# Patient Record
Sex: Male | Born: 1940 | Race: White | Hispanic: No | Marital: Married | Smoking: Former smoker
Health system: Southern US, Community
[De-identification: ages and names within clinical notes are randomized; demographics above are authoritative.]

## PROBLEM LIST (undated history)

## (undated) DIAGNOSIS — I255 Ischemic cardiomyopathy: Secondary | ICD-10-CM

## (undated) DIAGNOSIS — N400 Enlarged prostate without lower urinary tract symptoms: Secondary | ICD-10-CM

## (undated) DIAGNOSIS — G4730 Sleep apnea, unspecified: Secondary | ICD-10-CM

## (undated) DIAGNOSIS — I2510 Atherosclerotic heart disease of native coronary artery without angina pectoris: Secondary | ICD-10-CM

## (undated) DIAGNOSIS — R011 Cardiac murmur, unspecified: Secondary | ICD-10-CM

## (undated) DIAGNOSIS — I509 Heart failure, unspecified: Secondary | ICD-10-CM

## (undated) DIAGNOSIS — I6529 Occlusion and stenosis of unspecified carotid artery: Secondary | ICD-10-CM

## (undated) DIAGNOSIS — N35914 Unspecified anterior urethral stricture, male: Secondary | ICD-10-CM

## (undated) DIAGNOSIS — N189 Chronic kidney disease, unspecified: Secondary | ICD-10-CM

## (undated) DIAGNOSIS — I495 Sick sinus syndrome: Secondary | ICD-10-CM

## (undated) DIAGNOSIS — I4891 Unspecified atrial fibrillation: Secondary | ICD-10-CM

## (undated) DIAGNOSIS — IMO0001 Reserved for inherently not codable concepts without codable children: Secondary | ICD-10-CM

## (undated) DIAGNOSIS — E785 Hyperlipidemia, unspecified: Secondary | ICD-10-CM

## (undated) DIAGNOSIS — N19 Unspecified kidney failure: Secondary | ICD-10-CM

## (undated) DIAGNOSIS — I77819 Aortic ectasia, unspecified site: Secondary | ICD-10-CM

## (undated) DIAGNOSIS — I493 Ventricular premature depolarization: Secondary | ICD-10-CM

## (undated) DIAGNOSIS — R3129 Other microscopic hematuria: Secondary | ICD-10-CM

## (undated) DIAGNOSIS — I701 Atherosclerosis of renal artery: Secondary | ICD-10-CM

## (undated) DIAGNOSIS — N390 Urinary tract infection, site not specified: Secondary | ICD-10-CM

## (undated) DIAGNOSIS — I10 Essential (primary) hypertension: Secondary | ICD-10-CM

## (undated) HISTORY — DX: Benign prostatic hyperplasia without lower urinary tract symptoms: N40.0

## (undated) HISTORY — DX: Occlusion and stenosis of unspecified carotid artery: I65.29

## (undated) HISTORY — DX: Atherosclerotic heart disease of native coronary artery without angina pectoris: I25.10

## (undated) HISTORY — DX: Aortic ectasia, unspecified site: I77.819

## (undated) HISTORY — DX: Unspecified atrial fibrillation: I48.91

## (undated) HISTORY — DX: Urinary tract infection, site not specified: N39.0

## (undated) HISTORY — DX: Heart failure, unspecified: I50.9

## (undated) HISTORY — DX: Essential (primary) hypertension: I10

## (undated) HISTORY — DX: Reserved for inherently not codable concepts without codable children: IMO0001

## (undated) HISTORY — DX: Unspecified kidney failure: N19

## (undated) HISTORY — DX: Chronic kidney disease, unspecified: N18.9

## (undated) HISTORY — DX: Unspecified anterior urethral stricture, male: N35.914

## (undated) HISTORY — DX: Atherosclerosis of renal artery: I70.1

## (undated) HISTORY — DX: Cardiac murmur, unspecified: R01.1

## (undated) HISTORY — DX: Other microscopic hematuria: R31.29

## (undated) HISTORY — DX: Hyperlipidemia, unspecified: E78.5

---

## 2003-06-19 ENCOUNTER — Other Ambulatory Visit: Payer: Self-pay

## 2006-02-09 ENCOUNTER — Ambulatory Visit: Payer: Self-pay | Admitting: Gastroenterology

## 2006-02-22 HISTORY — PX: CORONARY ARTERY BYPASS GRAFT: SHX141

## 2006-03-25 ENCOUNTER — Ambulatory Visit: Payer: Self-pay | Admitting: *Deleted

## 2006-04-14 ENCOUNTER — Ambulatory Visit: Payer: Self-pay | Admitting: *Deleted

## 2006-04-27 ENCOUNTER — Ambulatory Visit: Payer: Self-pay | Admitting: *Deleted

## 2006-04-29 ENCOUNTER — Ambulatory Visit: Payer: Self-pay | Admitting: *Deleted

## 2006-04-29 ENCOUNTER — Inpatient Hospital Stay (HOSPITAL_COMMUNITY): Admission: RE | Admit: 2006-04-29 | Discharge: 2006-04-30 | Payer: Self-pay | Admitting: *Deleted

## 2006-04-29 ENCOUNTER — Encounter (INDEPENDENT_AMBULATORY_CARE_PROVIDER_SITE_OTHER): Payer: Self-pay | Admitting: Specialist

## 2006-04-29 HISTORY — PX: CAROTID ENDARTERECTOMY: SUR193

## 2006-05-19 ENCOUNTER — Ambulatory Visit: Payer: Self-pay | Admitting: *Deleted

## 2006-09-23 ENCOUNTER — Ambulatory Visit: Payer: Self-pay | Admitting: Cardiovascular Disease

## 2006-10-11 ENCOUNTER — Inpatient Hospital Stay: Payer: Self-pay | Admitting: Internal Medicine

## 2006-10-11 ENCOUNTER — Other Ambulatory Visit: Payer: Self-pay

## 2006-11-16 ENCOUNTER — Ambulatory Visit: Payer: Self-pay | Admitting: Cardiovascular Disease

## 2006-11-22 ENCOUNTER — Ambulatory Visit: Payer: Self-pay | Admitting: Surgery

## 2006-11-24 ENCOUNTER — Ambulatory Visit: Admission: RE | Admit: 2006-11-24 | Discharge: 2006-11-24 | Payer: Self-pay | Admitting: Surgery

## 2006-11-25 ENCOUNTER — Encounter: Payer: Self-pay | Admitting: Thoracic Surgery (Cardiothoracic Vascular Surgery)

## 2006-11-25 ENCOUNTER — Ambulatory Visit: Payer: Self-pay | Admitting: Vascular Surgery

## 2006-11-25 ENCOUNTER — Ambulatory Visit (HOSPITAL_COMMUNITY)
Admission: RE | Admit: 2006-11-25 | Discharge: 2006-11-25 | Payer: Self-pay | Admitting: Thoracic Surgery (Cardiothoracic Vascular Surgery)

## 2006-12-01 ENCOUNTER — Ambulatory Visit: Payer: Self-pay | Admitting: *Deleted

## 2006-12-02 ENCOUNTER — Ambulatory Visit: Payer: Self-pay | Admitting: Cardiology

## 2006-12-09 ENCOUNTER — Ambulatory Visit: Payer: Self-pay | Admitting: Internal Medicine

## 2006-12-09 ENCOUNTER — Ambulatory Visit: Payer: Self-pay | Admitting: Surgery

## 2006-12-09 ENCOUNTER — Inpatient Hospital Stay (HOSPITAL_COMMUNITY): Admission: RE | Admit: 2006-12-09 | Discharge: 2006-12-16 | Payer: Self-pay | Admitting: Surgery

## 2006-12-09 ENCOUNTER — Encounter (INDEPENDENT_AMBULATORY_CARE_PROVIDER_SITE_OTHER): Payer: Self-pay | Admitting: *Deleted

## 2006-12-09 HISTORY — PX: CAROTID ENDARTERECTOMY: SUR193

## 2006-12-16 ENCOUNTER — Encounter: Payer: Self-pay | Admitting: Internal Medicine

## 2007-01-03 ENCOUNTER — Ambulatory Visit: Payer: Self-pay | Admitting: Surgery

## 2007-01-03 ENCOUNTER — Encounter: Admission: RE | Admit: 2007-01-03 | Discharge: 2007-01-03 | Payer: Self-pay | Admitting: Surgery

## 2007-01-05 ENCOUNTER — Ambulatory Visit: Payer: Self-pay | Admitting: *Deleted

## 2007-01-24 ENCOUNTER — Ambulatory Visit: Payer: Self-pay | Admitting: Internal Medicine

## 2007-01-25 ENCOUNTER — Encounter: Payer: Self-pay | Admitting: Cardiovascular Disease

## 2007-02-23 ENCOUNTER — Encounter: Payer: Self-pay | Admitting: Cardiovascular Disease

## 2007-03-09 ENCOUNTER — Ambulatory Visit: Payer: Self-pay | Admitting: *Deleted

## 2007-03-26 ENCOUNTER — Encounter: Payer: Self-pay | Admitting: Cardiovascular Disease

## 2007-03-30 ENCOUNTER — Ambulatory Visit: Payer: Self-pay | Admitting: *Deleted

## 2007-04-12 ENCOUNTER — Ambulatory Visit (HOSPITAL_COMMUNITY): Admission: RE | Admit: 2007-04-12 | Discharge: 2007-04-12 | Payer: Self-pay | Admitting: *Deleted

## 2007-04-12 ENCOUNTER — Ambulatory Visit: Payer: Self-pay | Admitting: *Deleted

## 2007-04-20 ENCOUNTER — Ambulatory Visit: Payer: Self-pay | Admitting: *Deleted

## 2007-04-23 ENCOUNTER — Encounter: Payer: Self-pay | Admitting: Cardiovascular Disease

## 2007-05-08 ENCOUNTER — Ambulatory Visit: Payer: Self-pay | Admitting: Internal Medicine

## 2008-03-28 ENCOUNTER — Ambulatory Visit: Payer: Self-pay | Admitting: *Deleted

## 2008-08-01 IMAGING — CR DG CHEST 1V PORT
1 series · 1 of 1 positions shown · non-contrast
Comparison: Portable chest x-ray yesterday and two-view chest x-ray 11/24/2006.

CLINICAL DATA: Postop CABG. Extubation.

PORTABLE CHEST - 1 VIEW  [DATE]/3111 1511 hours:

[view not recorded]
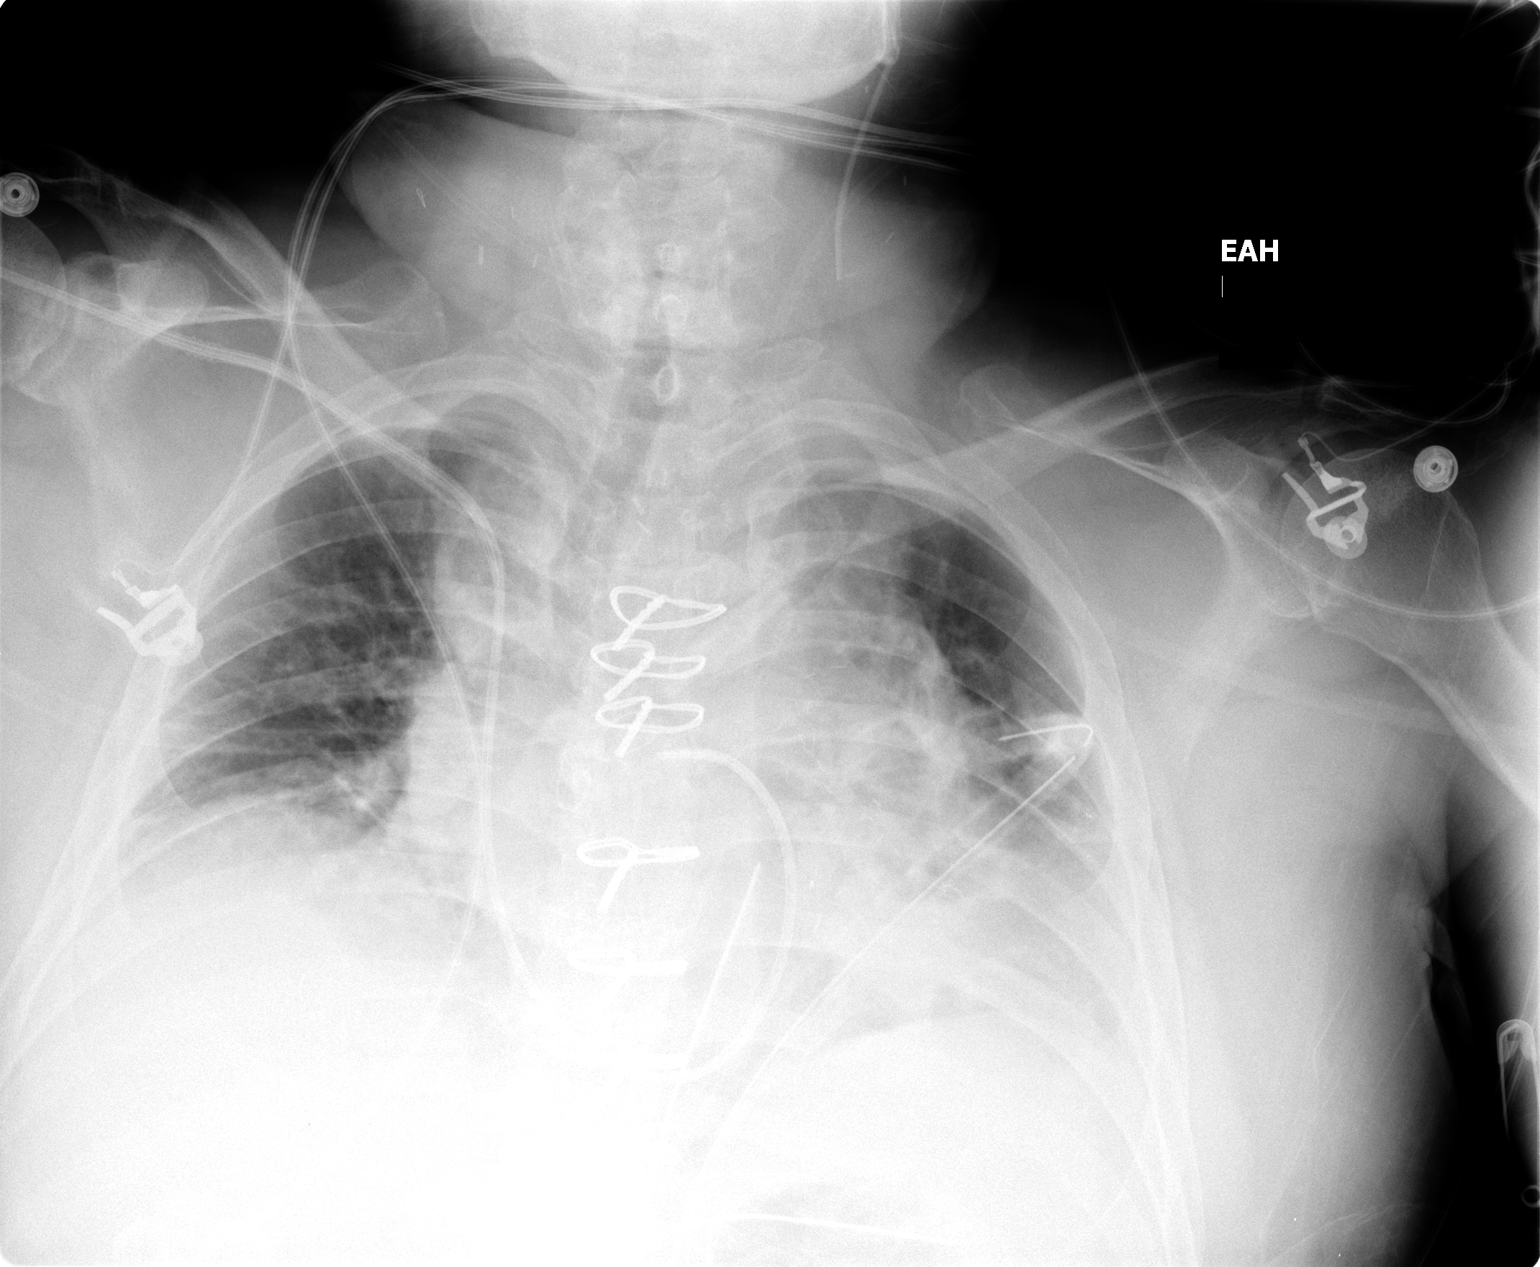

[1 of 1 positions shown; findings below may reference images not displayed]

FINDINGS: Interval extubation. Markedly suboptimal inspiration with atelectasis
in the lower lobes. Left chest tube in place with no pneumothorax. Swan-Ganz
catheter tip in the proximal right main pulmonary artery. Mediastinal drainage
tube. Stable cardiomegaly. Pulmonary venous hypertension without overt edema.
IMPRESSION: Remaining support apparatus satisfactory. No pneumothorax. Markedly suboptimal
inspiration post-extubation accounting for atelectasis in the lower lobes.

## 2009-03-26 ENCOUNTER — Ambulatory Visit: Payer: Self-pay | Admitting: Urology

## 2009-03-28 ENCOUNTER — Ambulatory Visit: Payer: Self-pay | Admitting: Vascular Surgery

## 2009-04-11 ENCOUNTER — Ambulatory Visit: Payer: Self-pay | Admitting: Family Medicine

## 2009-04-22 ENCOUNTER — Ambulatory Visit (HOSPITAL_COMMUNITY): Admission: RE | Admit: 2009-04-22 | Discharge: 2009-04-23 | Payer: Self-pay | Admitting: General Surgery

## 2009-04-22 ENCOUNTER — Encounter (INDEPENDENT_AMBULATORY_CARE_PROVIDER_SITE_OTHER): Payer: Self-pay | Admitting: General Surgery

## 2009-07-14 ENCOUNTER — Ambulatory Visit: Payer: Self-pay

## 2010-02-22 HISTORY — PX: CHOLECYSTECTOMY: SHX55

## 2010-04-06 ENCOUNTER — Ambulatory Visit (INDEPENDENT_AMBULATORY_CARE_PROVIDER_SITE_OTHER): Payer: Medicare Other | Admitting: Surgery

## 2010-04-06 ENCOUNTER — Other Ambulatory Visit (INDEPENDENT_AMBULATORY_CARE_PROVIDER_SITE_OTHER): Payer: Medicare Other

## 2010-04-06 DIAGNOSIS — Z48816 Encounter for surgical aftercare following surgery on the genitourinary system: Secondary | ICD-10-CM

## 2010-04-06 DIAGNOSIS — I6529 Occlusion and stenosis of unspecified carotid artery: Secondary | ICD-10-CM

## 2010-04-07 NOTE — Assessment & Plan Note (Signed)
OFFICE VISIT  Cody, Joseph DOB:  [birth year]                                       04/06/2010 ZOXWR#:[id number]  The patient comes in today for followup.  He is a former patient of Dr. Madilyn Fireman.  He has undergone bilateral carotid endarterectomy in 2008.  He has also been followed by Dr. Madilyn Fireman for renal artery stenosis which does not require intervention.  The patient is doing well at this time.  He has no vascular needs or issues.  He denies symptoms of TIA or stroke. Specifically no numbness, weakness in either extremity, no slurred speech, no amaurosis fugax.  PHYSICAL EXAM:  Vital signs:  Heart rate 63, blood pressure 138/80, respiratory rate 14.  General:  He is well-appearing, in no distress. HEENT:  Within normal limits.  Carotid incisions are all nicely healed. Lungs:  Clear bilaterally.  No wheezes.  Cardiovascular:  Regular rate and rhythm.  No murmur.  No carotid bruits.  Abdomen:  Obese. Neurological:  He is intact.  DIAGNOSTIC STUDIES:  Vascular lab studies were performed today and showed patent bilateral carotid endarterectomies without evidence of stenosis.  ASSESSMENT AND PLAN:  Status post bilateral carotid endarterectomy by Dr. Madilyn Fireman in 2008.  The patient will continue with ultrasound surveillance on a yearly basis.    Jorge Ny, MD Electronically Signed  VWB/MEDQ  D:  04/06/2010  T:  04/07/2010  Job:  [id number]  cc:   Dr Gabriel Cirri

## 2010-04-14 NOTE — Procedures (Unsigned)
CAROTID DUPLEX EXAM  INDICATION:  Bilateral carotid endarterectomies.  HISTORY: Diabetes:  No. Cardiac:  No. Hypertension:  Yes. Smoking:  Previous. Previous Surgery:  Right carotid endarterectomy on 04/29/2006, left carotid endarterectomy on 12/09/2006. CV History:  Currently asymptomatic. Amaurosis Fugax No, Paresthesias No, Hemiparesis No.                                      RIGHT             LEFT Brachial systolic pressure:         112               128 Brachial Doppler waveforms:         Normal            Normal Vertebral direction of flow:        Antegrade         Antegrade DUPLEX VELOCITIES (cm/sec) CCA peak systolic                   125               109 ECA peak systolic                   181               75 ICA peak systolic                   59                32 ICA end diastolic                   13                13 PLAQUE MORPHOLOGY:                  Heterogenous      Heterogenous PLAQUE AMOUNT:                      Mild              Mild PLAQUE LOCATION:                    CCA               CCA  IMPRESSION: 1. Patent bilateral carotid endarterectomy sites with no bilateral     internal carotid artery stenoses. 2. There is a velocity of 236 cm/s noted in the right proximal     subclavian artery which demonstrates turbulent flow. 3. No significant change in bilateral internal carotid arteries when     compared to the previous examination on 03/28/2009.  ___________________________________________ V. Charlena Cross, MD  CH/MEDQ  D:  04/07/2010  T:  04/07/2010  Job:  [id number]

## 2010-04-22 ENCOUNTER — Ambulatory Visit: Payer: Self-pay | Admitting: Gastroenterology

## 2010-05-05 HISTORY — PX: COLONOSCOPY: SHX174

## 2010-05-13 LAB — DIFFERENTIAL
Basophils Relative: 0 % (ref 0–1)
Lymphs Abs: 1.3 10*3/uL (ref 0.7–4.0)
Monocytes Relative: 11 % (ref 3–12)

## 2010-05-13 LAB — CBC
Platelets: 169 10*3/uL (ref 150–400)
RDW: 13.6 % (ref 11.5–15.5)

## 2010-05-13 LAB — COMPREHENSIVE METABOLIC PANEL
ALT: 20 U/L (ref 0–53)
Albumin: 3.8 g/dL (ref 3.5–5.2)
Alkaline Phosphatase: 66 U/L (ref 39–117)
CO2: 27 mEq/L (ref 19–32)
Chloride: 106 mEq/L (ref 96–112)
GFR calc non Af Amer: 55 mL/min — ABNORMAL LOW (ref >60)
Potassium: 4.2 mEq/L (ref 3.5–5.1)
Sodium: 139 mEq/L (ref 135–145)
Total Bilirubin: 0.6 mg/dL (ref 0.3–1.2)

## 2010-05-14 LAB — HM COLONOSCOPY

## 2010-07-07 NOTE — Discharge Summary (Signed)
NAMEJERICO, GRISSO              ACCOUNT NO.:  [id number]   MEDICAL RECORD NO.:  [id number]          PATIENT TYPE:  INP   LOCATION:  2033                         FACILITY:  [facility]   PHYSICIAN:  Evelene Croon, M.D.     DATE OF BIRTH:  1940/11/05   DATE OF ADMISSION:  12/09/2006  DATE OF DISCHARGE:  12/16/2006                               DISCHARGE SUMMARY   ADDENDUM:  Mr. Stan was tentatively felt to be stable for discharge on or about  December 15, 2006.  On that date, he had an episode of an atrial  tachycardia which appeared to be wide in complex.  This prompted a  cardiology consultation with Dr. Sherryl Manges, [facility].  Dr. Graciela Husbands has evaluated the patient and feels as  though this is an ectopic atrial dysrhythmia.  He should be continued on  his amiodarone as well as Coreg.  He was started on Cardizem earlier in  the hospitalization but this will be discontinued.  Dr. Graciela Husbands will  follow up with the patient in the future for further evaluation in  approximately 3 to 4 weeks and he has discussed this with the patient.   FINAL DIAGNOSIS:  Supraventricular tachycardia with aberration does not  appear to be an atrial flutter.   The patient's other diagnoses and hospital course is as previously  dictated.  For further details, please see that dictation.   Rowe Clack, P.A.-C. dictating for Evelene Croon, M.D.      Rowe Clack, P.A.-C.      Evelene Croon, M.D.  Electronically Signed    WEG/MEDQ  D:  12/16/2006  T:  12/17/2006  Job:  [id number]   cc:   Evelene Croon, M.D.  Duke Salvia, MD, [facility]  [name], MD  Lanna Poche, MD  P. Liliane Bade, M.D.

## 2010-07-07 NOTE — Op Note (Signed)
NAMEAVIS, MCMAHILL              ACCOUNT NO.:  [id number]   MEDICAL RECORD NO.:  [id number]          PATIENT TYPE:  INP   LOCATION:  2017                         FACILITY:  [facility]   PHYSICIAN:  Balinda Quails, M.D.    DATE OF BIRTH:  09/16/1940   DATE OF PROCEDURE:  12/09/2006  DATE OF DISCHARGE:                               OPERATIVE REPORT   SURGEON:  P Bud Face, MD   ASSISTANT:  Frutoso Schatz, PA.   ANESTHETIC:  General endotracheal.   PREOPERATIVE DIAGNOSIS:  Severe progressive left internal carotid artery  stenosis.   POSTOPERATIVE DIAGNOSIS:  Severe progressive left internal carotid  artery stenosis.   PROCEDURE:  Left carotid endarterectomy with saphenous vein patch  angioplasty.   CLINICAL NOTE:  Mr. [name] is a 70 year old male with known  atherosclerotic vascular disease.  He has previously undergone a right  carotid endarterectomy in March of this year.  He was seen by Dr. 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  He is scheduled to undergo coronary  bypass.  Preoperative Doppler revealed progression of left internal  carotid artery stenosis to greater than 80%.  He was seen in  consultation preoperatively and it was recommended he undergo common,  left carotid endarterectomy with coronary bypass for reduction stroke  risk.  He consented for surgery.  He is brought to the operating at this  time for combined left carotid endarterectomy and coronary artery  bypass.   OPERATIVE PROCEDURE:  The patient brought to the operating room stable  hemodynamic condition.  Placed under general endotracheal anesthesia.  Swan-Ganz catheter.  Foley line, arterial line in place.  The left neck,  chest and both legs were prepped and draped in sterile fashion.   Initial left carotid endarterectomy was carried out.  Curvilinear skin  incision made along the anterior border of left sternomastoid  muscle.  Dissection carried down through subcutaneous tissue with electrocautery.  Deep dissection carried through the platysma.  The facial vein ligated  with 2-0 silk and divided.  The left carotid bifurcation exposed.  The  common carotid artery mobilized down to the omohyoid muscle and  encircled with vessel loop.  Vagus nerve reflected posteriorly and  preserved.  At the bifurcation, the left superior thyroid external  carotid were freed and encircled with vessel loops.  The hypoglossal  nerve was then mobilized and retracted superiorly.  The left internal  carotid artery exposed up to the posterior belly of the digastric muscle  and encircled with vessel loop.   Evaluation left carotid bifurcation revealed extensive plaque disease  extending into the origin of left internal carotid artery.  The distal  left internal carotid artery was a soft vessel free of plaque.   The patient administered 7000 units heparin intravenously.  The left  carotid vessels controlled clamps.  Longitudinal arteriotomy made in the  distal left common carotid artery.  The arteriotomy extended across  carotid bulb and up into the internal carotid artery.  A shunt was  inserted.   There was  a large amount of plaque in the bulb and extending into the  left internal carotid artery.  Ulceration was present.  A high-grade  stenosis evident.   Plaque was removed with an endarterectomy elevator.  The plaque raised  down into the bulb where the superior thyroid and external carotid were  endarterectomized using an eversion technique.  The distal internal  carotid artery plaque feathered out well.  There was no significant  shelf distally.  Proximally the plaque was raised down into the common  carotid artery where it was divided transversely Potts scissors.  Site  irrigated with heparin saline solution.  Fragments plaque removed with  fine forceps.  Dextran irrigation also carried out.   A patch angioplasty  endarterectomy site was carried out with reverse  saphenous vein patch using running 6-0 Prolene suture.  The shunt was  then removed, all vessels well flushed.  Clamps removed directing  initial antegrade flow up the external carotid artery.  Following this,  the internal carotid was released.   There is an excellent pulse and Doppler signal in the distal internal  carotid artery.  Adequate hemostasis obtained.  At termination of the  coronary bypass procedure, the left neck was drained with a 15 round  Blake drain, exited inferior to the skin, affixed to skin with 2-0 silk  suture.  The sternomastoid fascia then closed running 2-0 Vicryl suture.  Platysma closed running 3-0 Vicryl suture.  Skin closed with 4-0  Monocryl.  Steri-Strips applied.  The patient tolerated procedure well.  Transferred directly to surgical intensive care unit for continued  monitoring.      Balinda Quails, M.D.  Electronically Signed     PGH/MEDQ  D:  12/09/2006  T:  12/12/2006  Job:  [id number]   cc:   Dr. Lajean Silvius, M.D.  Tama Gander, MD

## 2010-07-07 NOTE — Assessment & Plan Note (Signed)
OFFICE VISIT   Cody, Joseph  DOB:  1941-02-04                                       04/20/2007  ZOXWR#:[id number]   The patient returns today after undergoing an arteriogram for workup of  renal artery stenosis.  He had undergone a duplex scan, which revealed  potential bilateral renal artery stenosis.  He has had poorly controlled  hypertension.   His arteriogram revealed 2 left renal arteries and early bifurcation of  the main left renal artery.  There was mild to moderate stenosis present  in the inferior-most left renal artery.  The right renal system revealed  3 vessels.  The inferior-most revealed approximately 60% stenosis.  The  middle renal artery was without significant stenosis and the superior  pole revealed approximately 40% stenosis.  These were small vessels.   Due to the small size of these vessels and increased potential  complications associated with that, no intervention was undertaken.   The patient presents today for post-intervention visit.  His right groin  appears fine.  No evidence of pseudo-aneurysm or AV fistula.  Strong  right femoral pulse.  Mild ecchymosis.  No significant tenderness.   BP 138/74, pulse 46 per minute, respirations 16 per minute, O2  saturation 99%.   Will plan followup with him per parotid protocol in 6 months and a  repeat duplex evaluation in 1 year.   Balinda Quails, M.D.  Electronically Signed   PGH/MEDQ  D:  04/20/2007  T:  04/21/2007  Job:  [id number]   cc:   Dr. Gabriel Cirri

## 2010-07-07 NOTE — Procedures (Signed)
RENAL ARTERY DUPLEX EVALUATION   INDICATION:  Hypertension, renal artery stenosis.   HISTORY:  Diabetes:  No.  Cardiac:  No.  Hypertension:  Yes.  Smoking:  Quit in 1994.   RENAL ARTERY DUPLEX FINDINGS:  Aorta-Proximal:  53 cm/s  Aorta-Mid:  79 cm/s  Aorta-Distal:  92 cm/s  Celiac Artery Origin:  234 cm/s  SMA Origin:  187 cm/s                                    RIGHT               LEFT  Renal Artery Origin:             272/43 cm/s         151/39 cm/s  Renal Artery Proximal:           38/8 cm/s           116/27 cm/s  Renal Artery Mid:                36/11 cm/s          145/29 cm/s  Renal Artery Distal:             32/10 cm/s          36/12 cm/s  Hilar Acceleration Time (AT):    0.05 sec            0.02 sec  Renal-Aortic Ratio (RAR):        5.13                2.8  Kidney Size:                     9.5 cm X 5.7 cm     9.5 cm X 4.8 cm  End Diastolic Ratio (EDR):       0.36 to 0.29        0.33 to 0.37  Resistive Index (RI):            0.79                0.77   IMPRESSION:  1. Greater than 60% right renal artery stenosis.  2. Less than 60% left renal artery stenosis.  3. Kidneys are normal with respect to size and shape bilaterally.  4. No evidence of parenchymal disease bilaterally.   ___________________________________________  P. Liliane Bade, M.D.   MC/MEDQ  D:  03/09/2007  T:  03/09/2007  Job:  [id number]

## 2010-07-07 NOTE — Assessment & Plan Note (Signed)
OFFICE VISIT   [name], [name]  DOB:  1941-01-23                                        January 03, 2007  CHART #:  [id number]   The patient returns today for followup, status post coronary artery  bypass graft surgery x4 on 12/09/2006.  He also had a left carotid  endarterectomy performed by Dr. Liliane Bade at the same time.  He was  sent home on amiodarone for postoperative atrial fibrillation.  He was  seen by Dr. Sherryl Manges for evaluation of wide complex tachycardia  which was felt to be an atrial tachycardia.  He said that he saw Dr.  Graciela Husbands recently and his amiodarone was discontinued.  He has been walking  daily without chest pain or shortness of breath.   PHYSICAL EXAMINATION:  His blood pressure is 150/80, his pulse is 59 and  regular, respiratory rate is 18 and unlabored and oxygen saturation on  room air is 96%.  He looks well.  Cardiac exam shows a regular rate and  rhythm with normal heart sounds.  His lung exam is clear.  His chest  incision is healing well and the sternum is stable.  His leg incision is  healing well and there is no lower extremity edema.  His left neck  incision is healing well.  He appears neurologically intact.   Followup chest x-ray today shows a small left pleural effusion with left  basilar atelectasis.   MEDICATIONS:  His medications are Coreg 3.125 mg b.i.d., aspirin 81 mg  daily, Lipitor 80 mg nightly, Cardizem LA 120 mg daily, Plavix 75 mg  daily, and Xanax p.r.n.   IMPRESSION AND PLAN:  Overall, the patient appears to be making good  recovery following his surgery.  He appears to be maintaining a sinus  rhythm.  His amiodarone has been discontinued.  He is hypertensive today  and he may need further treatment for that.  His heart rate is  relatively slow, so I do not think he would tolerate an increase in his  Coreg.  He may benefit from an ACE inhibitor, although I do not know  what his creatinine is at  this time.  I will leave that decision up to  his primary physician and cardiologist.  I did encourage him to continue  walking as much as possible.  I told him he could return to driving a  car, but should refrain from lifting anything heavier than 10 pounds for  a total of 3 months from the date of surgery.  He will continue to  follow up with his primary physician and cardiologist and will contact  me if he develops any problems with his incisions.   Evelene Croon, M.D.  Electronically Signed   BB/MEDQ  D:  01/03/2007  T:  01/04/2007  Job:  [id number]   cc:   DR. Gabriel Cirri IN [place], [place]  DR. ARIDA IN Marcelline Mates, MD, FACC  P. Liliane Bade, M.D.

## 2010-07-07 NOTE — Procedures (Signed)
CAROTID DUPLEX EXAM   INDICATION:  Followup of carotid disease.   HISTORY:  Diabetes:  No.  Cardiac:  No.  Hypertension:  Yes.  Smoking:  Previous.  Previous Surgery:  Bilateral CEA.  CV History:  Asymptomatic.  Amaurosis Fugax Yes No, Paresthesias Yes No, Hemiparesis Yes No                                       RIGHT             LEFT  Brachial systolic pressure:         115               125  Brachial Doppler waveforms:         Triphasic         Triphasic  Vertebral direction of flow:        Antegrade         Antegrade  DUPLEX VELOCITIES (cm/sec)  CCA peak systolic                   120               147  ECA peak systolic                   213               191  ICA peak systolic                   89                54  ICA end diastolic                   39                20  PLAQUE MORPHOLOGY:                  Mixed             Mixed  PLAQUE AMOUNT:                      Mild              Mild  PLAQUE LOCATION:                    CCA, ICA          CCA, bifurcation   IMPRESSION:  1. Mild plaque noted bilaterally.  2. Bilateral internal carotid artery and left common carotid artery      are tortuous.  3. Bilateral external carotid artery stenosis.   ___________________________________________  Larina Earthly, M.D.   CJ/MEDQ  D:  03/28/2009  T:  03/28/2009  Job:  [id number]

## 2010-07-07 NOTE — Letter (Signed)
January 24, 2007    [name], MD  [address]  [place], [place]  [address]   RE:  [name], [name]  MRN:  [id number]  /  DOB:  [birth year]   Dear Dr. Kirke Corin:   Mr. Odonohue is seen today following his hospitalization at [facility] for  bypass surgery that was notable for wide complex tachycardia. The  mechanism which appeared to be supraventricular tachycardia with  aberration, but not atrial fibrillation. He was treated with amiodarone  for the last five weeks and he has had no recurrent palpitations of  which he is aware.   Apparently, he has also had an intercurrent echo which he recalls you  saying looked fine.   His current medications include amiodarone, Plavix, aspirin, carvedilol  and Micardis and Lipitor.   On examination today, his blood pressure is borderline elevated at  138/80, his pulse was 49.  LUNGS:  Clear.  HEART: Sounds were regular.  EXTREMITIES: Were warm without edema.   Electrocardiogram dated today demonstrated sinus rhythm at 49 with  interval at 0.19/0.12/0.50. The axis was mildly leftward at -24.   IMPRESSION:  1. Wide complex tachycardia thought to be CT with aberration.  2. Ischemic heart disease with:      a.     Prior bypass surgery.      b.     Previously depressed left ventricular function at estimated       30-40%, but apparently now near normal.  3. Peripheral vascular disease with impending surgery.  4. History of renal failure on ACE inhibitors.   Mr. Maready is doing terrific. I am very glad to hear that his echo was  normal.   We will stop his amiodarone today and I will plan to see him in three  months time just for one more time just to make sure there are no  further problems with palpitations. If there is anything that I can do  further in the interim with him or with any of your other patients,  please do not hesitate to contact me.    Sincerely,      Duke Salvia, MD, [facility]  Electronically Signed    SCK/MedQ   DD: 01/24/2007  DT: 01/24/2007  Job #: [id number]   CC:    Balinda Quails, M.D.

## 2010-07-07 NOTE — Op Note (Signed)
[name], [name]              ACCOUNT NO.:  [id number]   MEDICAL RECORD NO.:  [id number]          PATIENT TYPE:  INP   LOCATION:  2017                         FACILITY:  [facility]   PHYSICIAN:  Evelene Croon, M.D.     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   OPERATIVE PROCEDURE:  Median sternotomy, extracorporeal circulation,  coronary artery bypass graft surgery x4 using a left internal mammary  artery graft to the left anterior descending coronary artery, with a  sequential saphenous vein graft to the first and second obtuse marginal  branches of the left circumflex coronary artery, and a saphenous vein  graft to the posterior descending branch of the left circumflex coronary  artery.  Endoscopic vein harvesting from the right leg.   ATTENDING SURGEON:  Evelene Croon, M.D.   ASSISTANT:  Sheliah Plane, MD.   SECOND ASSISTANT:  Rowe Clack, P.A.-C.   ANESTHESIA:  General endotracheal.   CLINICAL HISTORY:  This patient is a 70 year old gentleman with multiple  cardiac risk factors who underwent right carotid endarterectomy in March  of 2008.  He apparently had an echocardiogram and a stress test at that  time that were abnormal.  He underwent cardiac catheterization on September 23, 2006 which showed left main and severe two-vessel coronary artery  disease.  He was then scheduled for percutaneous intervention on  November 16, 2006, but catheterization at that time showed 60% distal  left main stenosis.  There was 80% ostial LAD stenosis and a 70% mid-LAD  stenosis.  There was 70% ostial left circumflex stenosis.  The second  marginal had an ostial stenosis.  The right coronary artery was a small  nondominant vessel  without significant disease.  After review of the  angiogram and examination of the patient, it was felt that coronary  artery bypass graft surgery was the best treatment.  Preoperative  carotid Dopplers showed a high grade left internal carotid artery  stenosis.  He was seen in consultation by Dr. Bud Face who felt  that concomitant left carotid endarterectomy should be performed to  decrease the stroke risk.  I discussed the operative procedure of  coronary artery bypass surgery with him and his wife, including  alternatives, benefits and risks, including bleeding, blood transfusion,  infection, stroke, myocardial infarction, graft failure and death.  He  understood this and agreed to proceed.   OPERATIVE PROCEDURE:  The patient was taken to the operating room and  placed on the table in the supine position.  After induction of general  endotracheal anesthesia, a Foley catheter was placed in the bladder  using sterile technique.  Then the chest, abdomen and both lower  extremities were prepped and draped in the usual sterile manner.  The  left neck was prepped into the operative field.  Then a left carotid  endarterectomy was performed by  Dr. Bud Face and will be dictated  separately.  At the conclusion of that procedure, the left neck was  packed with a gauze pad and closed loosely with sutures for temporary  hemostasis while the coronary artery bypass surgery was being performed.   Then the chest was opened through a median sternotomy incision.  The  pericardium was opened in the midline.  Examination of the heart showed  good ventricular contractility.  The ascending aorta had no palpable  plaques in it.  There was a large amount of epicardial fat.   Then the left internal mammary artery was harvested from the chest wall  as a pedicle graft.  This was a medium caliber vessel with ________.  At  the same time a segment of the greater saphenous vein was harvested from  the  right leg using endoscopic vein harvest technique.  This vein was of  medium size and good quality.   Then the patient was heparinized and, when an adequate activated partial  thromboplastin time was achieved, the distal ascending aorta was  cannulated using a 20-French aortic cannula for arterial inflow.  Venous  outflow was achieved using a two-stage venous cannula for the right  atrial appendage.  An antegrade cardioplegia and bent cannula was  inserted in the aortic root.  Then the patient was placed in  cardiopulmonary bypass and the distal coronary was identified.  The LAD  was a large graftable vessel.  The diagonal branches were tiny vessels.  The left circumflex gave off 2 large marginal vessels, both of which  were graftable.  It terminated in a posterior descending branch which  was a moderate sized vessel that was graftable.  The right coronary  artery was a small nondominant vessel that was beneath the epicardial  fat and not visible.   Then the aorta was cross-clamped and 1000 mL of cold blood antegrade  cardioplegia was administered in the aortic root with quick arrest of  the heart.  Systemic hypothermia to 28 degrees centigrade and topical  hypothermia flush saline was used.  A temperature probe was placed in  the septum, insufflated and passed to the pericardium.   Then the first distal anastomosis was performed to the first marginal  branch.  The internal diameter of this vessel was about 2 mm.  The  conduit used was a segment of the greater saphenous vein and the  anastomosis performed in a sequential side-to-side manner using  continuous 7-0 Prolene suture.  Flow was measured to the graft and was  excellent.   The second distal anastomosis was performed to the second marginal  branch.  The internal diameter was also about 2 mm.  The conduit used  was the same segment of greater saphenous vein and the anastomosis  performed in a sequential end-to-side manner using  continuous 7-0  Prolene suture.  Flow was measured to the graft and was excellent.  Then  another dose of cardioplegia was given down to the main graft and in the  aortic root.   The third distal anastomosis was performed in the posterior descending  coronary artery.  The internal diameter of this vessel was about 1.6 mm.  The conduit used was a third segment of greater saphenous vein and the  anastomosis performed in an end-to-side manner using continuous 7-0  Prolene suture.  Flow was measured through the graft and was excellent.   The fourth distal anastomosis was performed in the midportion of the  left anterior descending  coronary artery.  The internal diameter of this  vessel was about 2.5 mm.  The conduit used was a left internal mammary  graft and this was brought through an opening in the left pericardium  anterior to the phrenic nerve.  This was anastomosed to the LAD in an  end-to-side manner using continuous 8-0 Prolene suture.  The pedicle was  sutured to the epicardium with 6-0 Prolene sutures.  The patient was  rewarmed to 37 degrees centigrade.  With a cross-clamp in place, the two  proximal vein graft anastomoses were performed to the aortic root in an  end-to-side manner using continuous 6-0 Prolene suture.  Then the clamp  was moved from the mammary pedicle.  There was rapid warming of the  ventricular septum and return was spontaneous ventricular fibrillation.  The cross-clamp was removed with a time of 72 minutes, and the patient  was defibrillated in a sinus rhythm.  The proximal and distal  anastomoses appeared hemostatic and LITA graft satisfactory.  Graft  markers were placed around the proximal anastomosis.  Temporary right  ventricular and right atrial pacing wires were placed and pulled through  the skin.   When the patient had rewarmed to 37 degrees centigrade, he was weaned  from cardiopulmonary bypass on no medications.  Total bypass time was 90   minutes.  Cardiac function appeared excellent with a cardiac output of 5  L/min.  Protamine was  given and venous and aortic cannula was removed  without difficulty.  Hemostasis was achieved.  Three chest tubes were  placed with 2 in the posterior pericardium, 1 in the left pleural space  and 1 in the anterior mediastinum.  The sternum was then closed with  double #6 stainless steel wires.  The fascia was closed with continuous  #1 Vicryl suture.  Subcutaneous tissue was closed with continuous 2-0  Vicryl and the skin with 3-0 Vicryl subcuticular closure.  The lower  extremity vein harvest site was closed in layers in a similar manner.  Sponge, needle and instrument counts were correct according to the scrub  nurse.  Dry sterile dressings were applied over the incisions, around  the chest tubes which were hooked to Pleur-evac suction.   Then the left neck was opened, removing the temporary sutures and the  gauze packing.  Hemostasis was achieved without difficulty..  There was  a good pulsation in the internal carotid artery beyond the patch.  A 10-  French flat Jackson-Pratt drain was pulled through a separate stab  incision and positioned in the depth of the wound.  The muscles were  then reapproximated with continuous 2-0 Vicryl suture.  The skin was  closed with a 3-0 Vicryl subcuticular closure.  The sponge, needle and  instrument counts were correct.  A dry sterile dressing was applied over  this wound.  The patient was then transported to the surgical intensive  care unit in guarded but stable condition.      Evelene Croon, M.D.  Electronically Signed     BB/MEDQ  D:  12/09/2006  T:  12/10/2006  Job:  [id number]   cc:   Dr. Rachel Moulds  Dr. Sharman Cheek

## 2010-07-07 NOTE — Procedures (Signed)
RENAL ARTERY DUPLEX EVALUATION   INDICATION:  Follow-up right renal artery stenosis.   HISTORY:  Diabetes:  No.  Cardiac:  No.  Hypertension:  Yes.  Smoking:  Quit.   RENAL ARTERY DUPLEX FINDINGS:  Aorta-Proximal:  101 cm/s  Aorta-Mid:  95 cm/s  Aorta-Distal:  90 cm/s  Celiac Artery Origin:  191 cm/s  SMA Origin:  216 cm/s                                    RIGHT               LEFT  Renal Artery Origin:             127 cm/s            182 cm/s  Renal Artery Proximal:                               182 cm/s  Renal Artery Mid:                61 cm/s             173 cm/s  Renal Artery Distal:             63 cm/s             129 cm/s  Hilar Acceleration Time (AT):    0.058 sec           0.048 sec  Renal-Aortic Ratio (RAR):        1.26                1.8  Kidney Size:                     10.5 cm X 5.14 cm   10.9 cm X 5.9 cm  End Diastolic Ratio (EDR):  Resistive Index (RI):            0.74                0.71   IMPRESSION:  1. Previously documented elevated velocities at right renal artery      origin suggestive of >60% stenosis were unable to be replicated.  2. Left renal artery shows no evidence of significant stenosis.  3. Bilateral kidneys appear within normal limits in regards to shape      and size.  4. Technically difficult study due to body habitus and bowel gas.  5. Dr. Madilyn Fireman was informed of results and stated to recheck in 1 year.   ___________________________________________  P. Liliane Bade, M.D.   AS/MEDQ  D:  03/28/2008  T:  03/28/2008  Job:  [id number]

## 2010-07-07 NOTE — Letter (Signed)
May 08, 2007    [name], MD  [address]  [place], [place] [address]   RE:  [name], [name]  MRN:  [id number]  /  DOB:  [birth year]   Dr. Kirke Corin,   [name] comes in.  His palpitations are now quiescent off the  amiodarone.   He has undergone an extensive intercurrent evaluation for hypertension  and potentially secondary to renal artery hypertension, which turned out  to be non-incriminating.   His blood pressure today was not too bad at 146/75.  His pulse was a low  at 45.  Heart sounds were regular.  The extremities were without edema,  and his lungs were clear.   IMPRESSION:  1. Palpitations, specifically atrial arrhythmias.  2. Modest bradycardia.  3. Coronary artery disease with prior bypass grafting (October, 2008).  4. Exercise-associated relative hypotension, as noted in cardiac      rehabilitation.   [name] is doing fine from an arrhythmia point of view.  I am  bothered a little bit by his exercise-associated low blood pressure,and  I wonder whether it may not be worth putting him on a treadmill to see  what is happening with that.  I do not understand what the mechanism  would be, from what he describes.   As it relates to his palpitations, he seemed to be quiescent, so I have  told him that we would be glad to see him again as needed or at your  request.  Thanks very much for allowing [place] to participate in his care.    Sincerely,      Duke Salvia, MD, [facility]  Electronically Signed    SCK/MedQ  DD: 05/08/2007  DT: 05/08/2007  Job #: [id number]

## 2010-07-07 NOTE — Op Note (Signed)
[name], BOSSMAN              ACCOUNT NO.:  [id number]   MEDICAL RECORD NO.:  [id number]          PATIENT TYPE:  AMB   LOCATION:  SDS                          FACILITY:  [facility]   PHYSICIAN:  Juleen China IV, MDDATE OF BIRTH:  06-Jul-1940   DATE OF PROCEDURE:  04/12/2007  DATE OF DISCHARGE:  04/12/2007                               OPERATIVE REPORT   PREOPERATIVE DIAGNOSIS:  Rule out right renal artery stenosis.   POSTOPERATIVE DIAGNOSIS:  Rule out right renal artery stenosis.   PROCEDURE PERFORMED:  1. Ultrasound-guided right common femoral artery access.  2. Abdominal aortogram.  3. Retrograde right femoral artery angiogram and closure device      (StarClose).   COMPLICATIONS:  None.   INDICATIONS:  This is a 70 year old gentleman with a history of vascular  disease who was found to have right renal artery stenosis greater than  60% on duplex ultrasound.  He has had increase in the number of blood  pressure medicines he requires.  He comes in for arteriogram  with  possibility of intervention.   DESCRIPTION OF PROCEDURE:  The patient was identified in the holding  area and taken to room 7.  He was placed supine on the table.  Bilateral  groins were prepped and draped in standard sterile fashion.  A  time-out  was called.  The right common femoral artery was evaluated with  ultrasound and found to be widely patent.  Lidocaine 1% was used for  local anesthesia.  Using ultrasound guidance, the right common femoral  artery was accessed with an 18-gauge needle.  An 0.035 Bentson wire was  then advanced in a retrograde fashion into the abdominal aorta under  fluoroscopic visualization.  A 6-French sheath was placed.  Over the  wire, Omniflush catheter was placed in the level of L1, and abdominal  aortogram was obtained.  Multiple obliquities were utilized to best  evaluate the renal arteries.   FINDINGS:  The visualized portions of the suprarenal abdominal aorta  showed  minimal disease.  There are 2 left renal arteries with an early  bifurcation of the main left renal artery.  There does not appear to be  any evidence of hemodynamically significant stenosis.  There are 3 right  renal arteries visualized.  The inferior renal artery shows  approximately 60% stenosis.  The middle renal artery is without  significant stenosis.  The superior, which is the main renal artery, has  luminal narrowing of approximately 40%.  Remaining portion of the  infrarenal abdominal aorta is widely patent.  It is heavily calcified.   After the above dimensions were obtained, a decision was made not to  intervene.  A retrograde right femoral artery angiogram was performed to  evaluate for closure device.  Access was found to be appropriate.  StarClose was successfully deployed.  The patient tolerated the  procedure well and was taken to the recovery room in stable condition.   IMPRESSION:  1. Three right renal arteries with mild-to-moderate stenosis within      the inferior branch.  The superior branch appears to be the main  artery and it is narrowing approximately 40%.  2. Two left renal arteries without significant stenosis.           ______________________________  V. Charlena Cross, MD  Electronically Signed     VWB/MEDQ  D:  04/12/2007  T:  04/12/2007  Job:  [id number]

## 2010-07-07 NOTE — Consult Note (Signed)
VASCULAR SURGERY CONSULTATION   AMIR, FICK R  DOB:  June 21, 1940                                       12/01/2006  BJYNW#:[id number]   PRIMARY CARE PHYSICIAN:  Steele Sizer.   PRIMARY CARDIOLOGIST:  Dr. Romilda Joy in [place].   REASON FOR CONSULTATION:  Progressive severe left internal carotid  artery stenosis.   HISTORY:  Patient is a 70 year old gentleman with known atherosclerotic  vascular disease.  He was seen in consultation by Dr. Laneta Simmers on  September 30th of this year with left main and severe two vessel  coronary artery disease.  Aftrer evaluation by Dr. Laneta Simmers, he was  scheduled to undergo coronary artery bypass.   Preoperative Doppler evaluation revealed a severe left internal carotid  artery stenosis greater than 80% by velocity measurement.  Moderate-to-  severe soft, noncalcific plaque was noted in the distal left common  carotid artery and Protonix to mid left internal carotid artery.   Patient is status post right carotid end arterectomy carried out on  April 29, 2006 at [facility].  Doppler evaluation reveals his  right carotid end arterectomy site to be widely patent.  Vertebral flow  is antegrade bilaterally.   Patient has no history of documented stroke.  He denies symptoms of  sensory, motor, or visual deficit.  No speech problems.  He is right-  handed.   PAST MEDICAL HISTORY:  1. Hyperlipidemia.  2. Hypertension.  3. Prostate nodule.  4. Cardiomyopathy with reduced ejection fraction.   MEDICATIONS:  1. Baby aspirin 81 mg daily.  2. Micardis 40 mg daily.  3. Coreg 40 mg daily.  4. Lipitor 80 mg daily.  5. Plavix 75 mg daily (discontinued September 30th).   ALLERGIES:  None known.   FAMILY HISTORY:  Mother died age 13 with complications of myocardial  infarction.  She also had a history of stroke.  Father died at age 15  with a history of myocardial infarction.   SOCIAL HISTORY:  Patient is semi-retired.   Works for the [employer]  system four hours daily.  He is married.  Does not use tobacco products.  Discontinued tobacco use 14 years ago.  Had previously smoked for  approximately 20 pack years.  Does not consume alcohol.  Has two grown  children.   REVIEW OF SYSTEMS:  No weight change or anorexia.  Denies fevers or  chills.  No chest pain, shortness of breath, or palpitations.  Denies  cough, sputum, or hemoptysis.  No abdominal pain, constipation, or  diarrhea.  Denies dysuria, frequency, or hematuria.  Musculoskeletal,  neurologic, hematologic, eyes, ears, nose, and throat negative.  He does  not some anxiety recently and insomnia awaiting his upcoming surgery.   PHYSICAL EXAMINATION:  General:  Patient is alert and oriented in no  acute distress.  BP is 180/83, pulse 45 per minute, respirations 18 per  minute.  HEENT:  Mouth and throat clear.  Extraocular movements are  intact.  Normocephalic.  Neck:  Supple.  No thyromegaly or adenopathy.  Cardiovascular:  Left carotid bruit.  Normal heart sounds.  No murmurs,  rubs or gallops.  Respiratory:  Air entry equal bilaterally.  No rales  or rhonchi.  Abdomen:  Soft, nontender.  No masses or organomegaly.  Bowel sounds active.  No bruits.  Musculoskeletal:  No bony deformities.  Full range of motion of upper and lower extremities.  Neurologic:  Patient is alert and oriented.  Moves extremities to command.  Sensation  intact.  Cranial nerves are intact.   IMPRESSION:  1. Severe progressive asymptomatic left internal carotid artery      stenosis.  2. Multivessel coronary artery disease.  3. Cardiomyopathy with reduced ejection fraction.  4. Hypertension.  5. Hyperlipidemia.  6. Obesity.  7. Situational anxiety.   MEDICAL DECISION MAKING:  Patient has critical coronary artery disease  which will require a coronary artery bypass.  Discovered to have  progressive, asymptomatic left internal carotid artery stenosis.  Will  plan  left carotid end arterectomy concomitant with coronary artery  bypass.  Potential risk of the operative procedure discussed with the  patient and his wife.  Major morbidity mortality directly related to his  carotid surgery 1-2%.   Procedure will be scheduled in consultation with Dr. Laneta Simmers.   Balinda Quails, M.D.  Electronically Signed  PGH/MEDQ  D:  12/01/2006  T:  12/02/2006  Job:  [id number]   cc:   Evelene Croon, M.D.  Dr. Vonita Moss  Dr. Romilda Joy in [place]

## 2010-07-07 NOTE — Consult Note (Signed)
[name], [name] NO.:  [id number]   MEDICAL RECORD NO.:  [id number]          PATIENT TYPE:  INP   LOCATION:  2033                         FACILITY:  [facility]   PHYSICIAN:  Duke Salvia, MD, FACCDATE OF BIRTH:  Jun 04, 1940   DATE OF CONSULTATION:  12/16/2006  DATE OF DISCHARGE:  12/16/2006                                 CONSULTATION   Thank you very much for asking [place] to see Mr. Shaydon Lease in  consultation for wide complex tachycardia.   Mr. Ostrovsky is a 70 year old gentleman who has known vascular disease  with prior CEA who presented with chest pain during the summer, had an  abnormal Myoview, underwent catheterization demonstrating significant  coronary disease. Anticipated PCI was ultimately deferred because of  progression of left main disease, and the patient was referred to Dr.  Laneta Simmers for revascularization which was accomplished about a week ago.   His ejection fraction has been measured over the summer at both 40% in  May and 30% in August. Apparently no left ventriculogram was undertaken  I think because of a history of acute renal failure in the context of  renal artery stenosis and ACE inhibition.  The peak creatinine was 3.7.   The postoperative course has been complicated by recurrent  tachyarrhythmia.  These are abrupt in onset and offset.  They are of  relatively short duration.  There is a wide complex version and a narrow  complex version with similar RR intervals.   There is also frequent atrial ectopy in a pattern of bigeminy and  trigeminy as well as frequent wide complex prematures which I suspect  are also, at least in large part, atrial in origin.   Thromboembolic risk factors are notable for hypertension and LV  dysfunction.   PAST MEDICAL HISTORY:  In addition to the above is notable for:  1. Dyslipidemia.  2. Prostatic hypertrophy.   REVIEW OF SYSTEMS:  Is otherwise broadly negative.   SOCIAL HISTORY:  He is married.   He has children.  He works  with the  [employer] and plays a lot of golf.   CURRENT MEDICATIONS:  1. Lipitor 80.  2. Protonix 40.  3. Lovenox 40.  4. Coreg 3.125 b.i.d.  5. Plavix.  6. Amiodarone 400 b.i.d.  7. Lasix 40.   KCl and Cardizem currently on hold.   DRUG INTOLERANCES:  Are related to his renal artery stenosis and include  ACE INHIBITORS.   PHYSICAL EXAMINATION:  GENERAL:  He is a middle-age, older Caucasian  male appearing his stated age of 70.  VITAL SIGNS:  His blood pressure is 122/74.  His pulse was 60.  Respirations were 18.  He is afebrile.  HEENT:  Exam demonstrated no icterus or xanthomata.  NECK:  No jugular venous distention was appreciated.  Carotids were not  examined.  BACK:  Without kyphosis or scoliosis.  LUNGS:  Clear.  HEART:  Sounds were regular without murmurs or gallops.  ABDOMEN:  Soft with active bowel sounds.  EXTREMITIES:  There is trace left edema, a little bit more edema on the  right.  SKIN:  Warm and dry.  NEUROLOGIC:  Exam was grossly normal.   Heart tracings are as noted previously.   IMPRESSION:  1. Recurrent supraventricular tachycardia with aberration and a cycle      length of about 400 milliseconds.  I do not think that this atrial      flutter given the brief runs and may well be atrial tachycardia or      a reentrant supraventricular tachycardia.  2. Ischemic cardiomyopathy.      a.     Status post coronary artery bypass grafting.      b.     Ejection fraction of 30-40%.      c.     Significant left atrial enlargement.  3. Peripheral vascular disease with prior bilateral carotid      endarterectomy.  4. History of acute renal failure on ACE inhibitor therapy.   Mr. Prien arrhythmia does not appear to be atrial fibrillation or  atrial flutter.  Therapeutic control for his symptoms has been initiated  with amiodarone and Coreg, and think that this is reasonable with the  idea that amiodarone should be  discontinued I think in the next 4 weeks  or so.   In the event that the patient has recurrent arrhythmia, ongoing use of  beta blockers and potentially calcium blockers, depending on his  ejection fraction, or catheter ablation could be considered.   Given the nature the arrhythmia, I do not think that Coumadin is  indicated.   Given his problems with ACE inhibitors, if his LV dysfunction persists,  I would consider the use of hydralazine-nitrate combinations for  reduction cardiovascular mortality and  morbidity risks.   Thank you very much for the consultation.      Duke Salvia, MD, [facility]  Electronically Signed     SCK/MEDQ  D:  12/16/2006  T:  12/17/2006  Job:  [id number]   cc:   Tama Gander, MD  Dr. Kary Kos, Alinda Dooms, M.D.

## 2010-07-07 NOTE — Consult Note (Signed)
NEW PATIENT CONSULTATION   KNOWLEDGE, ESCANDON R  DOB:  10/22/1940                                        November 22, 2006  CHART #:  [id number]   REFERRING PHYSICIAN:  Dr. [name].   REASON FOR CONSULTATION:  Left main and severe two vessel coronary  artery disease.   CLINICAL HISTORY:  I was asked by Dr. Kirke Corin to evaluate this gentleman  for consideration of coronary artery bypass graft surgery.  He is a 70-  year-old man with a strong family history of heart disease and a history  of previous smoking, who reports being asymptomatic, as far as his heart  disease goes.  He said that he was seen for a prostate infection at the  beginning of the year and subsequently underwent a physical examination  and was found to have a right cervical bruit.  Carotid Dopplers showed a  greater than 80% right internal carotid artery stenosis.  The patient  was seen by Dr. Park Liter in consultation and eventually by Dr.  Bud Face.  He underwent right carotid endarterectomy by Dr. Madilyn Fireman  on April 29, 2006.  The patient also subsequently underwent an  echocardiogram and a stress test.  I do not have the results of these  but according to the patient, it showed an enlarged heart with a  decreased ejection fraction as well as some valve leakage.  He then  underwent a cardiac catheterization on September 23, 2006, which showed left  main and severe two vessel coronary artery disease.  I do not have that  cath report or the films from that catheterization.  He then was  scheduled for a percutaneous intervention on November 16, 2006.  The  catheterization at that time showed a 60% distal left main stenosis.  There was 80% ostial LAD and 70% mid LAD stenosis.  There is 70% left  ostial left circumflex stenosis.  The right coronary artery was reported  to be a small nondominant vessel without significant disease.  It is  unclear whether a left ventriculogram was performed,  but I do not see  any result of that in the cath note.  Apparently, the decision was made  to not perform percutaneous intervention and to refer the patient for  surgical treatment.  The patient subsequently was admitted to the  hospital with heat exhaustion and dehydration with acute renal failure  on October 14, 2006.  He said that he played golf and then went into work  for about four hours.  Although he was drinking a lot of fluids the  whole time, he noted that he did not urinate at all and started feeling  very poorly.  He was admitted with a creatinine of around 3.7, which  improved with intravenous hydration to 1.2.  This episode of acute renal  failure was felt to be secondary to a combination of renal artery  stenosis, an ACE inhibitor, dehydration, and recent cardiac  catheterization.  Since then, the patient says that he has been feeling  fairly well.   REVIEW OF SYSTEMS:  GENERAL:  He denies any fever or chills.  He has had  no recent weight changes.  He denies fatigue but in talking to his wife,  it sounds like he has had some decreased exertional tolerance over the  past  year.  EYES:  Negative.  ENT:  Negative.  ENDOCRINE:  He denies diabetes and hypothyroidism.  CARDIOVASCULAR:  He denies any chest pain or pressure.  He denies  exertional shortness of breath.  He had no palpitations.  He denies PND  or orthopnea.  RESPIRATORY:  He has had no cough or sputum production.  He denies  hemoptysis.  There is no history of asthma.  GI:  He denies nausea or vomiting.  He denies melena or bright red blood  per rectum.  GU:  He has had no dysuria or hematuria.  He has had no problems since  his episode of acute renal failure.  VASCULAR:  He denies claudication and phlebitis.  He has never had a  vein stripping.  He denies any focal weakness or numbness.  He denies  dizziness or syncope.  He has never had a TIA or a stroke.  PSYCHIATRIC:  Negative .  HEMATOLOGICAL:  He denies  any history of bleeding disorders or easy  bleeding.  He has been on Plavix, and the last dose was taken yesterday.   ALLERGIES:  None.   FAMILY HISTORY:  Strongly positive for heart disease.  His mother died  at age 50 of myocardial infarction.  Father died at age 38 of myocardial  infarction.   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  He is a remote smoker and quit about 14  years ago.  He denies alcohol abuse.  He lives with his wife.   PAST MEDICAL HISTORY:  Significant for hypertension.  He has a history  of cardiomyopathy with an ejection fraction documented at about 31%.  He  has a history of peripheral vascular disease, status post right carotid  endarterectomy in March, 2008.  He has a known left internal carotid  artery stenosis that was about 50-79% on March 09, 2006.  This has  been followed by Dr. Bud Face.  He has a history of BPH and  prostatitis.  He has a history of hyperlipidemia.   PHYSICAL EXAMINATION:  Blood pressure is 148/77.  His pulse is 46 and  regular.  Respiratory rate is 18 and unlabored.  Oxygen saturation on  room air is 97%.  He is a well-developed, moderately obese white male in  no distress.  HEENT:  Normocephalic and atraumatic.  Pupils are equal,  round and reactive to light and accommodation.  Extraocular muscles are  intact.  Throat is clear.  Neck exam shows normal carotid pulses  bilaterally.  There is no bruit.  There is a right cervical scar from an  endarterectomy.  There is no adenopathy or thyromegaly.  Cardiac exam  shows a regular rate and rhythm with a normal S1 and S2.  There is no  murmur, rub or gallop.  His lungs are clear.  Abdomen exam shows active  bowel sounds.  His abdomen is soft, moderately obese and nontender.  There are no palpable masses or organomegaly.  Extremities:  No  peripheral edema.  Pedal pulses are palpable bilaterally.  His skin was  warm and  dry.  Neurologic:  Alert and oriented x3.  Motor and sensory  exams are grossly normal.   IMPRESSION:  Patient has left main and severe two vessel coronary artery  disease that is asymptomatic, according to him.  He has a history of  cardiomyopathy with an ejection fraction of about 31%.  I agree that  coronary artery bypass graft surgery is the best treatment to prevent  further ischemia and infarction as well as sudden death.  I discussed  the operative procedure with he and his wife, including alternatives,  risks and benefits, including but not limited to bleeding, blood  transfusion, infection, stroke, myocardial infarction, graft failure,  renal failure, and death.  They understand and would like to proceed  with surgery as quickly as possible.  We will plan to do this next week.   Evelene Croon, M.D.  Electronically Signed   BB/MEDQ  D:  11/22/2006  T:  11/22/2006  Job:  [id number]   cc:   [name], M.D.  Dr. Vonita Moss

## 2010-07-07 NOTE — Assessment & Plan Note (Signed)
OFFICE VISIT   JAKIN, PAVAO  DOB:  [birth year]                                       03/30/2007  ZOXWR#:[id number]   Patient is an established patient with a history of coronary artery  bypass and carotid endarterectomy carried out in October, 2008.  He had  a right carotid endarterectomy carried out in March, 2008.  Further  chronic medical conditions include hyperlipidemia, hypertension,  cardiomyopathy, and prostate nodule.   Following his most recent carotid endarterectomy and coronary artery  bypass procedure, he has had accelerated hypertension.  He is currently  on Micardis 40 mg daily, hydrochlorothiazide 12.5 mg daily, Coreg 6.25  mg b.i.d., Lipitor 10 mg daily, Plavix 75 mg daily, and aspirin 81 mg  daily.   Renal artery Doppler evaluation carried out on March 09, 2007 reveals  a >60% right renal artery stenosis, <60% left renal artery stenosis.  Kidneys are normal in size and shape bilaterally.   Patient has noted no recent chest pain or shortness of breath.   PHYSICAL EXAMINATION:  BP 139/81, pulse 59 per minute, respirations 18  per minute.  He is generally well.  No acute distress.  Alert and  oriented.  Abdomen is soft and nontender.  No abdominal bruits.  Femoral  pulses 2+ bilaterally.   I have reviewed these results with the patient and have recommended we  go ahead and do a diagnostic arteriogram with selective renal  arteriography and determine whether intervention would be appropriate to  improve blood pressure control and maintain renal function.  He is  agreeable to this.  I have discussed the details of this, including  potential risk of a major complication of 1%.  He has agreed to go ahead  with the procedure, which is scheduled for 04/12/07 at [facility].   Balinda Quails, M.D.  Electronically Signed   PGH/MEDQ  D:  03/30/2007  T:  04/03/2007  Job:  [id number]   cc:   Dr. Gabriel Cirri, [place], [place]

## 2010-07-07 NOTE — Procedures (Signed)
RENAL ARTERY DUPLEX EVALUATION   INDICATION:  Followup of a right renal artery stenosis.   HISTORY:  Diabetes:  No.  Cardiac:  No.  Hypertension:  Yes.  Smoking:  Previous.   RENAL ARTERY DUPLEX FINDINGS:  Aorta-Proximal:  139 cm/s  Aorta-Mid:  105 cm/s  Aorta-Distal:  109 cm/s  Celiac Artery Origin:  328 cm/s  SMA Origin:  391 cm/s                                    RIGHT               LEFT  Renal Artery Origin:             284 cm/s            163 cm/s  Renal Artery Proximal:           162, 139 cm/s       121 cm/s  Renal Artery Mid:                149, 113 cm/s       92 cm/s  Renal Artery Distal:             101, 97 cm/s        112 cm/s  Hilar Acceleration Time (AT):    m/s2                m/s2  Renal-Aortic Ratio (RAR):        2.04, 1             1.17  Kidney Size:                     7.4 x 4.25 cm       10 x 6.09 cm  End Diastolic Ratio (EDR):  Resistive Index (RI):            0.65, 0.56          0.69   IMPRESSION:  1. Right kidney is obstructed due to gallstone shadowing.  2. Three right renal arteries, one is greater than 60% stenosis, two      is free of stenosis , three is not visualized.  3. Superior mesenteric artery and celiac stenosis.  4. Left renal appears patent and free from stenosis.  5. Patient no longer needs to be seen for renal duplex per Dr. Arbie Cookey      unless symptoms occur.   ___________________________________________  Larina Earthly, M.D.   CJ/MEDQ  D:  03/28/2009  T:  03/28/2009  Job:  [id number]

## 2010-07-07 NOTE — Discharge Summary (Signed)
[name], [name]              ACCOUNT NO.:  [id number]   MEDICAL RECORD NO.:  [id number]          PATIENT TYPE:  INP   LOCATION:  2033                         FACILITY:  [facility]   PHYSICIAN:  Evelene Croon, M.D.     DATE OF BIRTH:  12-28-1940   DATE OF ADMISSION:  12/09/2006  DATE OF DISCHARGE:  12/13/2006                               DISCHARGE SUMMARY   FINAL DIAGNOSES:  1. Left main and severe three-vessel coronary artery disease.  2. Severe progressive left internal carotid artery stenosis.   IN-HOSPITAL DIAGNOSES:  1. Postoperative atrial fibrillation.  2. Volume overload postoperatively.  3. Acute blood loss anemia postoperatively.   SECONDARY DIAGNOSES:  1. Hyperlipidemia.  2. Hypertension.  3. Prostate nodule.  4. Cardiomyopathy with reduced ejection fraction.   IN-HOSPITAL OPERATIONS AND PROCEDURES:  1. Coronary artery bypass grafting x4, using:      a.     Left internal mammary artery graft to left anterior       descending coronary artery.      b.     Sequential saphenous vein graft to first and second obtuse       marginal branches.      c.     Saphenous vein graft to the posterior descending branch.  2. Endoscopic vein harvesting from right leg.  3. Left carotid endarterectomy, with saphenous vein patch angioplasty.   HISTORY AND PHYSICAL/HOSPITAL COURSE:  The patient is a 70 year old  gentleman who has multiple cardiac risk factors, who underwent right  carotid endarterectomy in March 2008.  He apparently had an  echocardiogram and a stress test at that time that were abnormal.  He  underwent cardiac catheterization on September 23, 2006, which showed left  main and severe two-vessel coronary artery disease.  He was then  scheduled for percutaneous intervention on November 16, 2006; but  catheterization at that time showed 60% distal left main stenosis.  There was 80% ostial LAD stenosis and 70% mid LAD stenosis.  There was a  70% ostial left circumflex  lesion.  The right coronary artery was a  small, nondominant vessel without significant disease.   After catheterization Dr. Laneta Simmers was consulted.  After further review of  the angiogram, it was felt best for the patient to undergo coronary  artery bypass grafting rather than percutaneous intervention.  Dr.  Laneta Simmers discussed the risks and benefits.  The patient acknowledged  understanding and agreed to proceed.  Surgery was scheduled for December 09, 2006.   Preoperatively, the patient underwent bilateral carotid duplex  ultrasound -- which showed a high-grade left internal carotid artery  stenosis.  Dr. Madilyn Fireman was consulted.  Dr. Madilyn Fireman discussed with the  patient about undergoing left carotid endarterectomy with the coronary  artery bypass grafting.  The patient acknowledged understanding and  agreed to proceed.  Left-sided endarterectomy was scheduled with  coronary artery bypass grafting for December 09, 2006.   For details of the patient's past medical history and physical  examination, please see dictated H&P.   The patient was taken to the operating room, where he underwent a left  carotid endarterectomy with saphenous vein patch angioplasty as well as  coronary artery bypass grafting x4; using a left internal mammary artery  graft to the left anterior descending coronary artery, sequential  saphenous vein graft to first and second obtuse marginal branches, a  saphenous vein graft to posterior descending branch.  Endoscopic vein  harvesting from the right leg was done.  The patient tolerated this  procedure well and was transferred to the intensive care unit in stable  condition.   Postoperatively, the patient was noted to be hemodynamically stable.  He  was extubated the evening of surgery.  Post-extubation the patient was  noted to be alert and oriented x4; neurologically intact.   On postoperative day #1 the patient's vital signs were stable.  He was  able to be weaned from  objects.  Swan-Ganz catheter discontinued in  normal fashion.  The patient had a follow-up chest x-ray noted to be  stable.  There minimal drainage from chest tubes, and the chest tube was  discontinued in normal fashion.  The patient also had a JP placed in his  left carotid endarterectomy incision site; and this had mild drainage.  On postoperative day #1 it was discontinued.   The patient had slight volume overload and was started on diuretics.  He  also had acute blood loss anemia; hemoglobin/hematocrit 10 and 30% on  postoperative day #1.  The patient was asymptomatic and this was  followed.  The patient was felt stable for transfer to PCTU.   While on the telemetry floor the patient went into atrial fibrillation  the morning of postoperative day #3.  He was started on amiodarone IV.  After initiation of IV amiodarone, the patient did convert back to  normal sinus rhythm.  His external pacer had been placed on [facility]  backup, and started on IV amiodarone.  This was able to be discontinued.  The patient heart rate maintaining in the 70s.   The patient's IV amiodarone was discontinued on postoperative day #4 and  he was started on p.o. amiodarone.  He was currently in normal sinus  rhythm.  We will continue to monitor.  The patient remained afebrile on  the telemetry floor.  His weight was continued to be checked daily and  he continued to receive Lasix.  He is currently 9.2 kg above his  preoperative weight on postoperative day #4.   From a pulmonary standpoint, the patient continued to use his incentive  spirometer.  He was able to be weaned off oxygen, with saturations  greater than 90% on room air.  Again, he was in normal sinus rhythm  prior to discharge home.  His incisions were clean, dry and intact and  healing well.  Recheck hemoglobin and hematocrit on postoperative day #2  was stable at 10.3 and 29.3%.  The patient was out of bed ambulating  well without difficulty.  He  was tolerating diet; no nausea or vomiting  noted.  No dysphagia noted.  His left carotid endarterectomy incision  was stable at discharge.   The patient remained stable with normal sinus rhythm, and off oxygen.  We plan to discharge home within the next 24-48 hours.   LABS:  (Postoperative day #4) BMP:  Sodium 137, potassium 4.5, chloride  100, bicarb 31, BUN 19, creatinine 1.3, glucose 117.  CBC:  (postoperative  day #2)  White count 11.6, hemoglobin 10.3, hematocrit  29.3.   FOLLOW-UP APPOINTMENTS:  A follow-up appointment has been arranged with  Dr. Donata Clay for December 30, 2006 at 12:15 p.m..  The patient will need  to obtain a PMI and chest x-ray 30 minutes prior to this appointment.  The patient will need to follow up with Dr. Kary Kos in 2 weeks.  He will  need to contact Dr. Val Eagle office to make these arrangements.  He also  will need to follow up Dr. Shella Spearing, his primary care doctor in 2-4  weeks, as well as Dr. Madilyn Fireman in 3 weeks.  He will need to contact our  office to make these arrangements.   ACTIVITY:  Patient instructed no driving until released to do so.  No  lifting over 10 pounds.  He is told to ambulate 3-4 times per day.  Progress as tolerated and to continue breathing exercises.   INCISIONAL CARE:  The patient is told to shower, washing his incisions  using soap and water.  He is to contact the office if he develops any  drainage or opening from any of his incision sites.   DIET:  The patient educated on diet to be low-fat, low-salt.   DISCHARGE MEDICATIONS:  1. Aspirin 81 mg daily.  2. Coreg 3.125 mg b.i.d..  3. Lipitor 80 mg at night.  4. Lasix 40 mg daily x5 days.  5. Potassium chloride 20 mEq daily x5 days.  6. Plavix 75 mg daily.  7. Amiodarone 400 mg b.i.d. x14 days and 200 mg b.i.d..  8. Xanax 0.25 mg t.i.d. p.r.n.  9. Oxycodone 5 mg 1-2 tablets q.4-6 h. p.r.n. pain.      Theda Belfast, [place]      Evelene Croon, M.D.  Electronically  Signed    KMD/MEDQ  D:  12/13/2006  T:  12/13/2006  Job:  [id number]   cc:   Balinda Quails, M.D.  Hoy Morn, M.D.  [place], [place] Thermon Leyland, M.D.

## 2010-07-07 NOTE — Assessment & Plan Note (Signed)
OFFICE VISIT   KOHL, POLINSKY  DOB:  05/20/1940                                       01/05/2007  EAVWU#:[id number]   The patient is status post left carotid endarterectomy carried out at  the time of coronary artery bypass on 12/09/06.  He has been recovering  well since his surgery.  He has no major complaints at this time.  Saw  Dr. Laneta Simmers in the office yesterday without problems.   Left neck incision looks good.  Cranial nerves are intact.  Vital signs  reveal a BP of 150/82.  Pulse 48 per minute.  Respirations are 18 per  minute.  O2 sat 99%.   The patient has done well following his carotid surgery.  We will plan  carotid Doppler per protocol in 6 months.  Also, I have scheduled him to  undergo renal Doppler evaluation in January as he has had accelerated  hypertension.   Balinda Quails, M.D.  Electronically Signed   PGH/MEDQ  D:  01/05/2007  T:  01/06/2007  Job:  [id number]   cc:   Marcello Fennel, M.D.

## 2010-07-07 NOTE — Procedures (Signed)
CAROTID DUPLEX EXAM   INDICATION:  Followup carotid artery disease.   HISTORY:  Diabetes:  No.  Cardiac:  No.  Hypertension:  Yes.  Smoking:  Quit.  Previous Surgery:  Bilateral CEA.  CV History:  No.  Amaurosis Fugax No, Paresthesias No, Hemiparesis No                                       RIGHT             LEFT  Brachial systolic pressure:         150               152  Brachial Doppler waveforms:         WNL               WNL  Vertebral direction of flow:        Antegrade         Antegrade  DUPLEX VELOCITIES (cm/sec)  CCA peak systolic                   88                96  ECA peak systolic                   231               108  ICA peak systolic                   87                51  ICA end diastolic                   26                18  PLAQUE MORPHOLOGY:                  N/A               N/A  PLAQUE AMOUNT:                      N/A               N/A  PLAQUE LOCATION:                    N/A               N/A   IMPRESSION:  1. Bilateral ICAs show no evidence of restenosis status post CEA.  2. Right ECA stenosis.       ___________________________________________  P. Liliane Bade, M.D.   AS/MEDQ  D:  03/28/2008  T:  03/28/2008  Job:  [id number]

## 2010-07-10 NOTE — Discharge Summary (Signed)
NAMESAMUEL, MCPEEK              ACCOUNT NO.:  [id number]   MEDICAL RECORD NO.:  [id number]          PATIENT TYPE:  INP   LOCATION:  3310                         FACILITY:  [facility]   PHYSICIAN:  Balinda Quails, M.D.    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   DISCHARGE/SECONDARY DIAGNOSES:  1. 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  2. Hypertension.  3. Hyperlipidemia.  4. History of colon polyps with colonoscopy in December 2007.  5. Prostate nodule status post biopsy in December 2007.   NO KNOWN DRUG ALLERGIES.   PROCEDURE:  1. On April 29, 2006, right carotid endarterectomy with Dacron patch      angioplasty.  2. Excision of right neck lipoma.  3. Biopsy of right jugular digastric lymph node by Dr. Denman George.   BRIEF HISTORY:  Mr. Paulson is a 70 year old male who was found to have  an asymptomatic right carotid bruit during physical examination.  He was  initially seen by Dr. Guinevere Scarlet with carotid Doppler evaluation and CT  angiogram.  His carotid Doppler was consistent with moderate to left  moderate to severe left internal carotid artery stenosis.  His vertebral  flow bilaterally was antegrade.  The carotid Doppler was done in  [facility] on March 09, 2006.  The CT  angiogram of the neck was done on March 25, 2006, revealing high grade  proximal right internal carotid artery stenosis of 85-90%, and moderate  to severe left internal carotid artery stenosis at 65-70%.  The patient  denies any history of documented stroke or TIA.  Dr. Madilyn Fireman recommended  that he undergo elective right carotid  endarterectomy to reduce his risk  for future stroke.  After discussing risks and benefits, the patient  agreed to proceed.   HOSPITAL COURSE:  Mr. Nazaire was electively admitted to [facility] on April 29, 2006, and underwent a right carotid endarterectomy.  He was extubated neurologically intact and after short stay recovery  unit, went to the step-down unit 300, where he remained until discharge.  Of note, he did require dopamine initially for hypotension.  However, by  morning this was discontinued and vitals were stable.  He was  bradycardic with a rate in the 40s to 60s.  However, this was his  baseline.  Oxygen saturation during the morning rounds was 90% on 2  liters per nasal cannula.  Labs were stable showing a white blood count  of 8.5, hemoglobin 13, hematocrit 38, platelet count 166.  Sodium 135,  potassium 2.8, BUN 21, creatinine 1.3, and blood glucose 120.  That  morning he was able to void without difficulty, ambulate, tolerate  regular food, and was weaned from supplemental oxygen.  His incision  appeared to be  healing well.  Neurologically, he remained intact.  Later  that date, it was felt he met criteria for discharge and we discharged  him home in stable condition postoperative day one, April 30, 2006.   Surgical biopsy showed a partially calcified atherosclerotic plaque in  the right carotid artery. The soft tissue biopsy was confirmed as a  lipoma.  The right jugular biopsy showed two lymph nodes with reactive  hyperplasia.   DISCHARGE MEDICATIONS:  1. Plavix 75 mg p.o. daily.  2. Micardis/HCTZ 40/12.5 mg p.o. daily.  3. Coreg 20 mg daily.  4. Lipitor 10 mg daily.  5. Aspirin 81 mg p.o. daily.  6. Tylox one to two tablets p.o. q.4 h p.r.n. pain.   DISCHARGE INSTRUCTIONS:  1. He is to increase activity slowly, avoid driving or heavy lifting      for the next 2 weeks.  2. On May 01, 2006, he may begin shower and clean incisions gently      with soap  and water.  3. He should call if he develops fever greater than 101, redness or      drainage from incision site.  4. He is to follow up with Dr. Madilyn Fireman in 2 weeks and our office will      contact him regarding specific appointment date and time.      Jerold Coombe, P.A.      Balinda Quails, M.D.  Electronically Signed    AWZ/MEDQ  D:  06/09/2006  T:  06/09/2006  Job:  [id number]   cc:   Vonita Moss, Dr.  Park Liter, Dr.

## 2010-07-10 NOTE — Op Note (Signed)
[name], Joseph              ACCOUNT NO.:  [id number]   MEDICAL RECORD NO.:  [id number]          PATIENT TYPE:  INP   LOCATION:  3310                         FACILITY:  [facility]   PHYSICIAN:  [name], M.D.    DATE OF BIRTH:  [birth year]   DATE OF PROCEDURE:  04/29/2006  DATE OF DISCHARGE:                               OPERATIVE REPORT   SURGEON:  [name], M.D.   ASSISTANT:  [name], P.A.   ANESTHETIC:  General endotracheal.   PREOPERATIVE DIAGNOSIS:  Severe right internal carotid artery stenosis.   POSTOPERATIVE DIAGNOSES:  1. Severe right internal carotid artery stenosis.  2. Right neck lipoma.  3. Right neck adenopathy.   OPERATIVE PROCEDURES:  1. Right carotid endarterectomy with Dacron patch angioplasty.  2. Excision of right neck lipoma  3. Excisional biopsy, right jugular digastric lymph node.   INDICATIONS FOR PROCEDURE:  [name] is a 70 year old male  with asymptomatic severe right internal carotid artery stenosis.  He was  seen in consultation in the office and it was recommended he undergo  right carotid endarterectomy for reduction of stroke risk.  The patient  consented for surgery.  He was brought to the operating room at this  time for planned procedure.  The risks of the operative procedure with a  major morbidity mortality 1-2%, to include but not limited to:  MI, CVA,  cranial nerve injury and death have been discussed.   OPERATIVE PROCEDURE:  The patient was brought the operating room in  stable hemodynamic condition.  He was placed under general endotracheal  anesthesia.  The right neck was prepped and draped in a sterile fashion.   A Foley catheter, arterial line and Swan-Ganz catheter were in place.   A curvilinear skin incision was made along the anterior border of the  right sternomastoid muscle.  Dissection carried through subcutaneous  tissue with electrocautery.  Platysma incised.  Deep through the  platysma  there was an obvious lipoma overlying the jugular vein.  This  was easily freed from the surrounding tissue.  Two large feeding veins  were ligated with 2-0 silk and divided.  The lipoma was then excised.  This appeared grossly benign.  Sent to pathology.   Dissection then carried deeply along the anterior border of the  sternomastoid muscle.  The carotid bifurcation was exposed.  Facial vein  was ligated with 3-0 silk and divided.  The common carotid artery was  mobilized down to the omohyoid muscle, and encircled with a vessel loop.  The vagus nerve reflected posteriorly and preserved.  The origin of the  superior thyroid and external carotid were freed and encircled with  vessel loops.  The internal carotid artery followed distally up to the  posterior belly of the digastric muscle and encircled with a vessel  loop.   Carotid bifurcation evaluated and revealed plaque extending into the  origin of the right internal carotid artery for several centimeters.  There was minimal calcification of the plaque.  The patient was  administered 7000 units of heparin intravenously.   The vagus nerve  and hypoglossal nerve were both preserved and clearly  identified.  The carotid vessels were controlled with clamps.  A  longitudinal arteriotomy was made in the distal common carotid artery.  The arteriotomy extended across the carotid bulb and up into the carotid  right internal carotid artery.  It was carried through the origin and  distally beyond the plaque.  There was a high-grade right internal  carotid artery stenosis present.  No ulceration was noted in the plaque.  A long tongue of plaque extended up into the right internal carotid  artery.   A shunt was inserted.   An endarterectomy elevator was used to remove the plaque.  The  endarterectomy carried down to the common carotid artery, where the  plaque was divided transversely with Potts scissors.  The plaque was  then raised up into  the bulb.  The superior thyroid and external carotid  were endarterectomized using an eversion technique.  The distal internal  carotid artery plaque then feathered out well.  Fragments of plaque were  removed with fine forceps.  Site was irrigated with heparin and saline  solution.   A patch angioplasty endarterectomy site was then carried out with a  Finesse Dacron patch using running 6-0 Prolene suture.  At completion of  the patch angioplasty the shunt was removed.  All vessels were well  flushed.  The clamps were then removed, directing initial antegrade flow  up the external carotid artery.  The internal carotid artery was then  released.  Excellent pulse and Doppler signal present in both the  internal and external carotid arteries.  Adequate hemostasis was  obtained.  The patient was administered 50 mg of protamine  intravenously.  Sponge and instrument counts correct.   The sternomastoid fascia was closed running 2-0 Vicryl suture.  Platysma  closed with running 3-0 Vicryl suture.  Skin closed with 4-0 Monocryl.  Steri-Strips applied.  There were no apparent complications.  The  patient was transferred to the recovery room in stable condition.  Neurologically intact.      [name], M.D.  Electronically Signed     PGH/MEDQ  D:  04/29/2006  T:  04/30/2006  Job:  [id number]   cc:   Dr. Park Liter  Dr. Adrian Blackwater  Dr. Steele Sizer

## 2010-11-13 LAB — PROTIME-INR: INR: 0.9

## 2010-11-13 LAB — CBC
MCHC: 34.4
MCV: 84.4
Platelets: 148 — ABNORMAL LOW
RDW: 15.3
WBC: 5.1

## 2010-11-13 LAB — COMPREHENSIVE METABOLIC PANEL
ALT: 20
AST: 17
Albumin: 3.9
Alkaline Phosphatase: 60
CO2: 28
Calcium: 9.3
Chloride: 105
GFR calc Af Amer: >60
Total Bilirubin: 0.6

## 2010-12-02 LAB — BASIC METABOLIC PANEL
BUN: 16
BUN: 19
CO2: 24
CO2: 29
CO2: 31
Calcium: 8.7
Calcium: 8.7
Calcium: 8.8
Chloride: 106
Chloride: 94 — ABNORMAL LOW
Creatinine, Ser: 1.3
Creatinine, Ser: 1.34
GFR calc Af Amer: >60
GFR calc Af Amer: >60
GFR calc non Af Amer: 50 — ABNORMAL LOW
GFR calc non Af Amer: 57 — ABNORMAL LOW
GFR calc non Af Amer: 58 — ABNORMAL LOW
Glucose, Bld: 111 — ABNORMAL HIGH
Glucose, Bld: 117 — ABNORMAL HIGH
Glucose, Bld: 119 — ABNORMAL HIGH
Glucose, Bld: 135 — ABNORMAL HIGH
Potassium: 4.3
Sodium: 136

## 2010-12-02 LAB — URINALYSIS, ROUTINE W REFLEX MICROSCOPIC
Protein, ur: NEGATIVE
Specific Gravity, Urine: 1.024
Urobilinogen, UA: 4 — ABNORMAL HIGH
pH: 5.5

## 2010-12-02 LAB — CBC
HCT: 29.8 — ABNORMAL LOW
HCT: 32.9 — ABNORMAL LOW
HCT: 34.6 — ABNORMAL LOW
Hemoglobin: 10.1 — ABNORMAL LOW
Hemoglobin: 10.3 — ABNORMAL LOW
Hemoglobin: 11.1 — ABNORMAL LOW
Hemoglobin: 11.8 — ABNORMAL LOW
MCHC: 33.8
MCHC: 33.8
MCHC: 34.1
MCHC: 34.1
MCHC: 34.2
MCHC: 35.2
MCV: 88.4
MCV: 90.1
MCV: 90.5
Platelets: 116 — ABNORMAL LOW
Platelets: 122 — ABNORMAL LOW
Platelets: 168
RBC: 3.31 — ABNORMAL LOW
RBC: 3.54 — ABNORMAL LOW
RDW: 13.7
RDW: 13.9
RDW: 14
RDW: 14.1 — ABNORMAL HIGH
RDW: 14.3 — ABNORMAL HIGH
WBC: 6.7

## 2010-12-02 LAB — POCT I-STAT 3, ART BLOOD GAS (G3+)
Acid-base deficit: 3 — ABNORMAL HIGH
Bicarbonate: 22.1
Bicarbonate: 22.6
Bicarbonate: 24.5 — ABNORMAL HIGH
O2 Saturation: 100
O2 Saturation: 98
Operator id: 203371
Patient temperature: 36.4
TCO2: 23
TCO2: 24
TCO2: 26
pCO2 arterial: 38.2
pCO2 arterial: 50 — ABNORMAL HIGH
pH, Arterial: 7.304 — ABNORMAL LOW
pH, Arterial: 7.36
pH, Arterial: 7.368
pO2, Arterial: 123 — ABNORMAL HIGH

## 2010-12-02 LAB — BLOOD GAS, ARTERIAL
Bicarbonate: 25.1 — ABNORMAL HIGH
O2 Saturation: 98
Patient temperature: 98.6
TCO2: 26.3
pO2, Arterial: 97.3

## 2010-12-02 LAB — COMPREHENSIVE METABOLIC PANEL
ALT: 31
AST: 22
Albumin: 3.8
Alkaline Phosphatase: 60
CO2: 24
Chloride: 105
GFR calc Af Amer: >60
Potassium: 4.3
Sodium: 135
Total Bilirubin: 0.9

## 2010-12-02 LAB — HEMOGLOBIN AND HEMATOCRIT, BLOOD: HCT: 27.5 — ABNORMAL LOW

## 2010-12-02 LAB — POCT I-STAT 4, (NA,K, GLUC, HGB,HCT)
Glucose, Bld: 102 — ABNORMAL HIGH
Glucose, Bld: 116 — ABNORMAL HIGH
Glucose, Bld: 97
Glucose, Bld: 99
HCT: 24 — ABNORMAL LOW
HCT: 26 — ABNORMAL LOW
HCT: 34 — ABNORMAL LOW
HCT: 38 — ABNORMAL LOW
Hemoglobin: 12.6 — ABNORMAL LOW
Hemoglobin: 12.9 — ABNORMAL LOW
Hemoglobin: 8.8 — ABNORMAL LOW
Operator id: 203371
Operator id: 3406
Operator id: 3406
Operator id: 3406
Potassium: 4
Potassium: 4.8
Potassium: 4.9
Potassium: 6 — ABNORMAL HIGH
Sodium: 133 — ABNORMAL LOW
Sodium: 134 — ABNORMAL LOW
Sodium: 136
Sodium: 136

## 2010-12-02 LAB — HEMOGLOBIN A1C: Hgb A1c MFr Bld: 5.5

## 2010-12-02 LAB — TYPE AND SCREEN: ABO/RH(D): O NEG

## 2010-12-02 LAB — POCT I-STAT GLUCOSE: Operator id: 3406

## 2010-12-02 LAB — CREATININE, SERUM: GFR calc Af Amer: >60

## 2010-12-03 LAB — BLOOD GAS, ARTERIAL
Acid-base deficit: 1.3
Drawn by: 206361
FIO2: 0.21
TCO2: 24.1
pCO2 arterial: 39.1
pO2, Arterial: 91.5

## 2010-12-03 LAB — URINALYSIS, ROUTINE W REFLEX MICROSCOPIC
Bilirubin Urine: NEGATIVE
Ketones, ur: NEGATIVE
Nitrite: NEGATIVE
Protein, ur: NEGATIVE
Urobilinogen, UA: 1

## 2010-12-03 LAB — PROTIME-INR: Prothrombin Time: 13.3

## 2010-12-03 LAB — COMPREHENSIVE METABOLIC PANEL
ALT: 27
AST: 21
Alkaline Phosphatase: 59
CO2: 22
Chloride: 108
Creatinine, Ser: 1.15
GFR calc Af Amer: >60
GFR calc non Af Amer: >60
Potassium: 4.5
Total Bilirubin: 0.7

## 2010-12-03 LAB — CBC
MCV: 89.5
RBC: 4.78
WBC: 6.2

## 2010-12-09 IMAGING — CR DG CHEST 2V
2 series · 2 of 2 positions shown · non-contrast
Comparison: 01/03/2007

CLINICAL DATA: CABG, CAD, productive cough, prior smoker.  Preop
cholelithiasis.

CHEST - 2 VIEW

[view not recorded (1 of 2)]
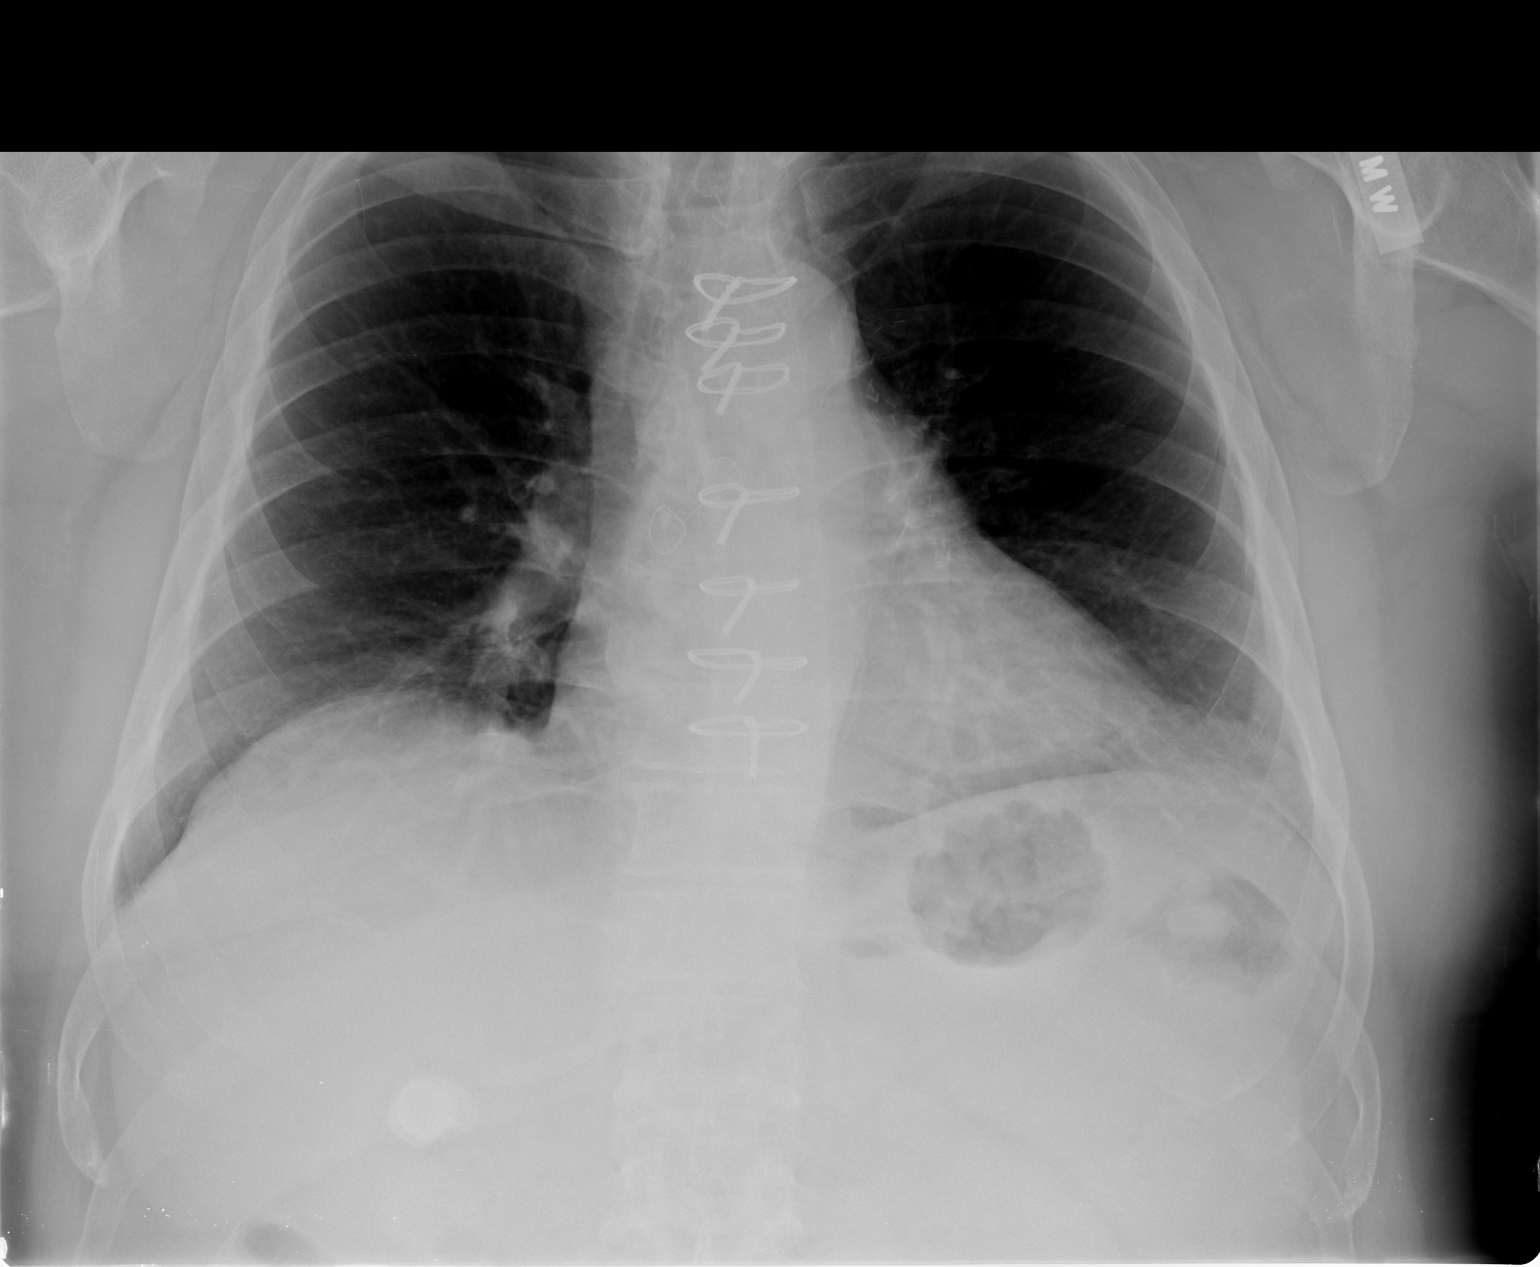

[view not recorded (2 of 2)]
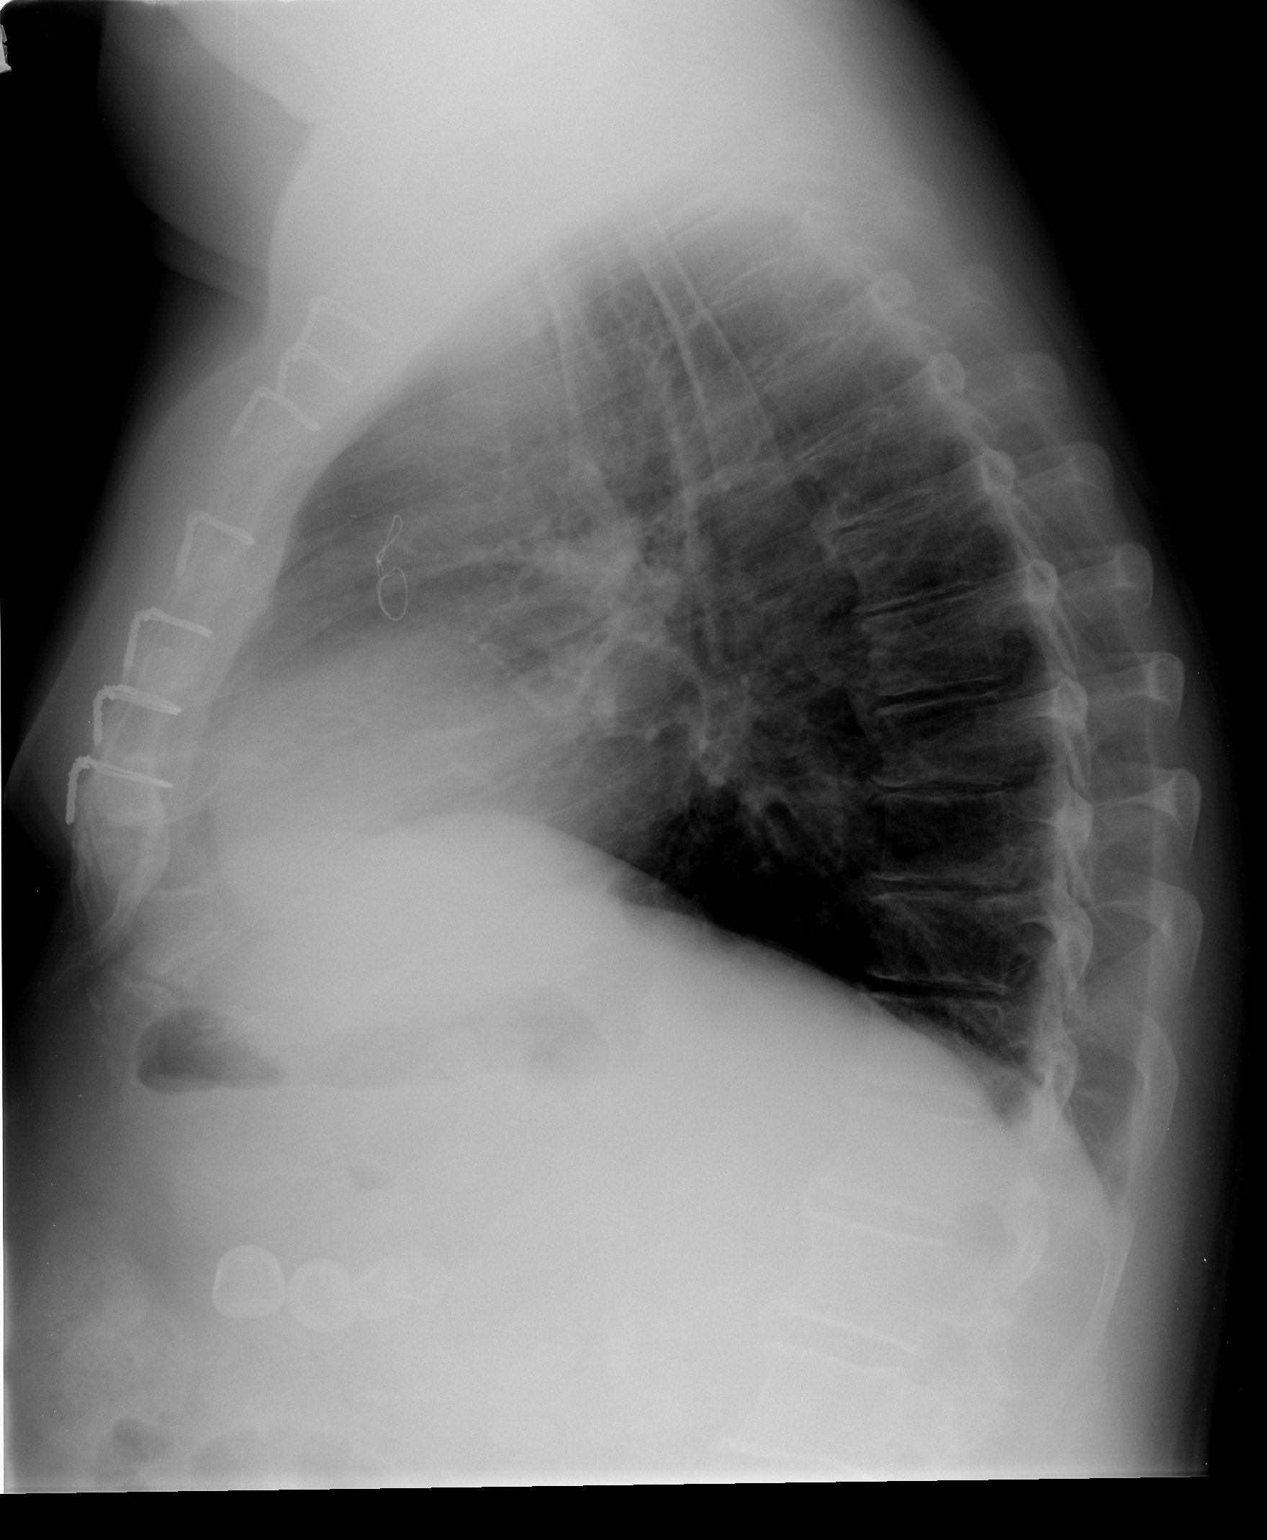

[2 of 2 positions shown; findings below may reference images not displayed]

FINDINGS: Prior CABG.  Heart is borderline in size.  Minimal
bibasilar atelectasis or scarring.  Otherwise lungs are clear.  No
effusions.  Gallstones again noted in the right upper quadrant.
IMPRESSION: Minimal bibasilar atelectasis or scarring.

Heart borderline enlarged.

## 2010-12-23 ENCOUNTER — Ambulatory Visit: Payer: Self-pay | Admitting: Cardiovascular Disease

## 2011-04-07 ENCOUNTER — Other Ambulatory Visit: Payer: Medicare Other

## 2011-04-07 ENCOUNTER — Other Ambulatory Visit (INDEPENDENT_AMBULATORY_CARE_PROVIDER_SITE_OTHER): Payer: Medicare Other | Admitting: *Deleted

## 2011-04-07 DIAGNOSIS — Z48812 Encounter for surgical aftercare following surgery on the circulatory system: Secondary | ICD-10-CM

## 2011-04-07 DIAGNOSIS — I6529 Occlusion and stenosis of unspecified carotid artery: Secondary | ICD-10-CM

## 2011-04-19 ENCOUNTER — Other Ambulatory Visit: Payer: Self-pay | Admitting: *Deleted

## 2011-04-19 DIAGNOSIS — Z48812 Encounter for surgical aftercare following surgery on the circulatory system: Secondary | ICD-10-CM

## 2011-04-19 DIAGNOSIS — I6529 Occlusion and stenosis of unspecified carotid artery: Secondary | ICD-10-CM

## 2011-04-20 ENCOUNTER — Encounter: Payer: Self-pay | Admitting: Surgery

## 2011-04-20 NOTE — Procedures (Unsigned)
CAROTID DUPLEX EXAM  INDICATION:  Follow up bilateral carotid endarterectomy.  HISTORY: Diabetes:  No. Cardiac:  No. Hypertension:  Yes. Smoking:  Previous. Previous Surgery:  Right carotid endarterectomy 04/29/2006, left carotid endarterectomy 12/09/2006. CV History:  Currently asymptomatic. Amaurosis Fugax No, Paresthesias No, Hemiparesis No                                      RIGHT             LEFT Brachial systolic pressure:         122               127 Brachial Doppler waveforms:         Normal            Normal Vertebral direction of flow:        Not visualized    Antegrade DUPLEX VELOCITIES (cm/sec) CCA peak systolic                   M=74, D=126       101 ECA peak systolic                   147               67 ICA peak systolic                   78                37 ICA end diastolic                   25                15 PLAQUE MORPHOLOGY:                  Heterogenous      Heterogenous PLAQUE AMOUNT:                      Minimal           Minimal PLAQUE LOCATION:                    CCA               CCA  IMPRESSION: 1. Patent bilateral carotid endarterectomy sites with no evidence of     re-stenosis of the internal carotid artery. 2. Stable in comparison to the previous exam.  ___________________________________________ V. Charlena Cross, MD  EM/MEDQ  D:  04/07/2011  T:  04/07/2011  Job:  [id number]

## 2011-08-30 ENCOUNTER — Ambulatory Visit: Payer: Self-pay | Admitting: Internal Medicine

## 2012-02-21 ENCOUNTER — Encounter: Payer: Self-pay | Admitting: Surgery

## 2012-04-06 ENCOUNTER — Ambulatory Visit: Payer: Medicare Other | Admitting: Neurosurgery

## 2012-04-06 ENCOUNTER — Other Ambulatory Visit: Payer: Medicare Other

## 2012-04-26 ENCOUNTER — Encounter: Payer: Self-pay | Admitting: Neurosurgery

## 2012-04-27 ENCOUNTER — Other Ambulatory Visit (INDEPENDENT_AMBULATORY_CARE_PROVIDER_SITE_OTHER): Payer: Medicare Other | Admitting: *Deleted

## 2012-04-27 ENCOUNTER — Encounter: Payer: Self-pay | Admitting: Neurosurgery

## 2012-04-27 ENCOUNTER — Other Ambulatory Visit: Payer: Self-pay | Admitting: *Deleted

## 2012-04-27 ENCOUNTER — Ambulatory Visit (INDEPENDENT_AMBULATORY_CARE_PROVIDER_SITE_OTHER): Payer: Medicare Other | Admitting: Neurosurgery

## 2012-04-27 DIAGNOSIS — I6529 Occlusion and stenosis of unspecified carotid artery: Secondary | ICD-10-CM | POA: Insufficient documentation

## 2012-04-27 DIAGNOSIS — Z48812 Encounter for surgical aftercare following surgery on the circulatory system: Secondary | ICD-10-CM

## 2012-04-27 NOTE — Progress Notes (Signed)
[facility] Carotid Office Note  CC: Carotid surveillance Referring Physician: Brabham  History of Present Illness: 72 year old male patient of Dr. Myra Gianotti status post right CEA in March 2008, left CEA in October 2008. The patient denies any signs or symptoms of CVA, TIA, amaurosis fugax or any neural deficit. The patient denies any new medical diagnoses or recent surgery.  Past Medical History  Diagnosis Date  . Carotid artery occlusion   . Renal artery stenosis   . Hypertension     ROS: [x]  Positive   [ ]  Denies    General: [ ]  Weight loss, [ ]  Fever, [ ]  chills Neurologic: [ ]  Dizziness, [ ]  Blackouts, [ ]  Seizure [ ]  Stroke, [ ]  "Mini stroke", [ ]  Slurred speech, [ ]  Temporary blindness; [ ]  weakness in arms or legs, [ ]  Hoarseness Cardiac: [ ]  Chest pain/pressure, [ ]  Shortness of breath at rest [ ]  Shortness of breath with exertion, [ ]  Atrial fibrillation or irregular heartbeat Vascular: [ ]  Pain in legs with walking, [ ]  Pain in legs at rest, [ ]  Pain in legs at night,  [ ]  Non-healing ulcer, [ ]  Blood clot in vein/DVT,   Pulmonary: [ ]  Home oxygen, [ ]  Productive cough, [ ]  Coughing up blood, [ ]  Asthma,  [ ]  Wheezing Musculoskeletal:  [ ]  Arthritis, [ ]  Low back pain, [ ]  Joint pain Hematologic: [ ]  Easy Bruising, [ ]  Anemia; [ ]  Hepatitis Gastrointestinal: [ ]  Blood in stool, [ ]  Gastroesophageal Reflux/heartburn, [ ]  Trouble swallowing Urinary: [ ]  chronic Kidney disease, [ ]  on HD - [ ]  MWF or [ ]  TTHS, [ ]  Burning with urination, [ ]  Difficulty urinating Skin: [ ]  Rashes, [ ]  Wounds Psychological: [ ]  Anxiety, [ ]  Depression   Social History History  Substance Use Topics  . Smoking status: Former Smoker    Types: Cigarettes    Quit date: 02/23/1992  . Smokeless tobacco: Not on file  . Alcohol Use: Not on file    Family History Family History  Problem Relation Age of Onset  . Heart disease Mother   . Heart attack Mother   .  Heart disease Father   . Heart attack Father     No Known Allergies  Current Outpatient Prescriptions  Medication Sig Dispense Refill  . aspirin 81 MG tablet Take 81 mg by mouth daily.      . clopidogrel (PLAVIX) 75 MG tablet Take 75 mg by mouth daily.      . rosuvastatin (CRESTOR) 10 MG tablet Take 10 mg by mouth daily.      Marland Kitchen spironolactone (ALDACTONE) 25 MG tablet Take 25 mg by mouth daily.      . valsartan (DIOVAN) 320 MG tablet Take 320 mg by mouth daily.      Marland Kitchen amLODipine (NORVASC) 5 MG tablet Take 5 mg by mouth daily.      Marland Kitchen telmisartan (MICARDIS) 80 MG tablet Take 80 mg by mouth daily.       No current facility-administered medications for this visit.    Physical Examination  Filed Vitals:   04/27/12 1405  BP: 152/84  Pulse: 64  Resp:     Body mass index is 34.54 kg/(m^2).  General:  WDWN in NAD Gait: Normal HEENT: WNL Eyes: Pupils equal Pulmonary: normal non-labored breathing , without Rales, rhonchi,  wheezing Cardiac: RRR, without  Murmurs, rubs or gallops; Abdomen: soft, NT, no masses Skin: no rashes, ulcers  noted  Vascular Exam Pulses: 3+ radial pulses bilaterally Carotid bruits: Carotid pulses to auscultation Extremities without ischemic changes, no Gangrene , no cellulitis; no open wounds;  Musculoskeletal: no muscle wasting or atrophy   Neurologic: A&O X 3; Appropriate Affect ; SENSATION: normal; MOTOR FUNCTION:  moving all extremities equally. Speech is fluent/normal  Non-Invasive Vascular Imaging CAROTID DUPLEX 04/27/2012  Right ICA 0 - 19% stenosis Left ICA 0 - 19% stenosis   ASSESSMENT/PLAN: Asymptomatic patient will followup in one year with repeat carotid duplex. His duplex from today is stable from one year ago. The patient's questions were encouraged and answered, he is in agreement with this plan.  Lauree Chandler ANP   Clinic MD: Darrick Penna

## 2012-06-18 LAB — URINALYSIS, COMPLETE
Bilirubin,UR: NEGATIVE
Glucose,UR: NEGATIVE mg/dL (ref 0–75)
Leukocyte Esterase: NEGATIVE
Nitrite: NEGATIVE
Protein: NEGATIVE
RBC,UR: 1 /HPF (ref 0–5)
Squamous Epithelial: <1
WBC UR: 1 /HPF (ref 0–5)

## 2012-06-18 LAB — COMPREHENSIVE METABOLIC PANEL
Alkaline Phosphatase: 62 U/L (ref 50–136)
Anion Gap: 4 — ABNORMAL LOW (ref 7–16)
Calcium, Total: 8.8 mg/dL (ref 8.5–10.1)
Creatinine: 1.26 mg/dL (ref 0.60–1.30)
EGFR (African American): >60
Potassium: 4.6 mmol/L (ref 3.5–5.1)
SGOT(AST): 19 U/L (ref 15–37)

## 2012-06-18 LAB — PROTIME-INR: Prothrombin Time: 13.8 secs (ref 11.5–14.7)

## 2012-06-18 LAB — CBC
HCT: 41 % (ref 40.0–52.0)
MCH: 30.4 pg (ref 26.0–34.0)
MCHC: 34.4 g/dL (ref 32.0–36.0)
Platelet: 177 10*3/uL (ref 150–440)
WBC: 7 10*3/uL (ref 3.8–10.6)

## 2012-06-19 ENCOUNTER — Observation Stay: Payer: Self-pay | Admitting: Family Medicine

## 2012-06-19 LAB — HEMOGLOBIN: HGB: 12.9 g/dL — ABNORMAL LOW (ref 13.0–18.0)

## 2012-06-20 LAB — CBC WITH DIFFERENTIAL/PLATELET
Eosinophil #: 0.1 10*3/uL (ref 0.0–0.7)
Eosinophil %: 2.3 %
HCT: 35.9 % — ABNORMAL LOW (ref 40.0–52.0)
HGB: 12.7 g/dL — ABNORMAL LOW (ref 13.0–18.0)
Lymphocyte #: 1.2 10*3/uL (ref 1.0–3.6)
MCHC: 35.3 g/dL (ref 32.0–36.0)
MCV: 86 fL (ref 80–100)
Monocyte #: 0.4 x10 3/mm (ref 0.2–1.0)
Monocyte %: 8.4 %
Neutrophil #: 3.3 10*3/uL (ref 1.4–6.5)
Neutrophil %: 65.1 %
RBC: 4.17 10*6/uL — ABNORMAL LOW (ref 4.40–5.90)
RDW: 13.3 % (ref 11.5–14.5)

## 2012-06-20 LAB — BASIC METABOLIC PANEL
BUN: 17 mg/dL (ref 7–18)
Calcium, Total: 8.6 mg/dL (ref 8.5–10.1)
Glucose: 79 mg/dL (ref 65–99)
Osmolality: 278 (ref 275–301)
Potassium: 4.3 mmol/L (ref 3.5–5.1)
Sodium: 139 mmol/L (ref 136–145)

## 2012-06-21 LAB — CBC WITH DIFFERENTIAL/PLATELET
Basophil #: 0 10*3/uL (ref 0.0–0.1)
Eosinophil #: 0.1 10*3/uL (ref 0.0–0.7)
Eosinophil %: 2.8 %
Lymphocyte #: 1 10*3/uL (ref 1.0–3.6)
Lymphocyte %: 25.9 %
MCH: 30.4 pg (ref 26.0–34.0)
MCHC: 35.9 g/dL (ref 32.0–36.0)
Monocyte #: 0.4 x10 3/mm (ref 0.2–1.0)
Neutrophil #: 2.3 10*3/uL (ref 1.4–6.5)
Neutrophil %: 61.1 %
WBC: 3.7 10*3/uL — ABNORMAL LOW (ref 3.8–10.6)

## 2013-03-27 ENCOUNTER — Other Ambulatory Visit: Payer: Self-pay | Admitting: Neurosurgery

## 2013-03-27 DIAGNOSIS — Z48812 Encounter for surgical aftercare following surgery on the circulatory system: Secondary | ICD-10-CM

## 2013-03-27 DIAGNOSIS — I6529 Occlusion and stenosis of unspecified carotid artery: Secondary | ICD-10-CM

## 2013-05-07 ENCOUNTER — Other Ambulatory Visit (HOSPITAL_COMMUNITY): Payer: Medicare Other

## 2013-05-07 ENCOUNTER — Other Ambulatory Visit: Payer: Medicare Other

## 2013-05-07 ENCOUNTER — Ambulatory Visit: Payer: Medicare Other | Admitting: Family

## 2013-05-31 ENCOUNTER — Encounter: Payer: Self-pay | Admitting: Family

## 2013-06-01 ENCOUNTER — Ambulatory Visit (HOSPITAL_COMMUNITY)
Admission: RE | Admit: 2013-06-01 | Discharge: 2013-06-01 | Disposition: A | Discharge status: Home / Self Care | Payer: Medicare Other | Source: Ambulatory Visit | Attending: Family | Admitting: Family

## 2013-06-01 ENCOUNTER — Ambulatory Visit (INDEPENDENT_AMBULATORY_CARE_PROVIDER_SITE_OTHER): Payer: Medicare Other | Admitting: Family

## 2013-06-01 ENCOUNTER — Encounter: Payer: Self-pay | Admitting: Family

## 2013-06-01 VITALS — BP 131/73 | HR 53 | Resp 53 | Ht 69.0 in | Wt 227.0 lb

## 2013-06-01 DIAGNOSIS — Z48812 Encounter for surgical aftercare following surgery on the circulatory system: Secondary | ICD-10-CM

## 2013-06-01 DIAGNOSIS — I6529 Occlusion and stenosis of unspecified carotid artery: Secondary | ICD-10-CM | POA: Insufficient documentation

## 2013-06-01 NOTE — Progress Notes (Signed)
Established Carotid Patient   History of Present Illness  [name] is a 73 y.o. male patient of Dr. Myra GianottiBrabham status post right CEA in March 2008, left CEA in October 2008. He returns today for follow up. He gets 4 hours walking into his daily routine, also plays golf. He denies claudication symptoms in his legs with walking. 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  Patient has Negative history of TIA or stroke symptom.  The patient denies amaurosis fugax or monocular blindness.  The patient  denies facial drooping.  Pt. denies hemiplegia.  The patient denies receptive or expressive aphasia.  Pt. denies extremity weakness.   Patient denies New Medical or Surgical History.  Pt Diabetic: No Pt smoker: former smoker, quit 21 years ago  Pt meds include: Statin : Yes ASA: Yes Other anticoagulants/antiplatelets: no   Past Medical History  Diagnosis Date  . Carotid artery occlusion   . Renal artery stenosis   . Hypertension   . Coronary artery disease     Social History History  Substance Use Topics  . Smoking status: Former Smoker    Types: Cigarettes    Quit date: 02/23/1992  . Smokeless tobacco: Never Used  . Alcohol Use: No    Family History Family History  Problem Relation Age of Onset  . Heart disease Mother   . Heart attack Mother   . Cancer Mother   . Hyperlipidemia Mother   . Hypertension Mother   . Heart disease Father     Heart Disease before age 260  . Heart attack Father   . Hyperlipidemia Father   . Hypertension Father     Surgical History Past Surgical History  Procedure Laterality Date  . Carotid endarterectomy  04/29/2006    right  . Carotid endarterectomy  12/09/2006    left  . Coronary artery bypass graft  2008  . Cholecystectomy  2012    No Known Allergies  Current Outpatient Prescriptions  Medication Sig Dispense Refill  . aspirin 81 MG tablet Take 81 mg by mouth daily.      . clopidogrel (PLAVIX) 75  MG tablet Take 75 mg by mouth daily.      . rosuvastatin (CRESTOR) 10 MG tablet Take 40 mg by mouth daily.       Marland Kitchen. spironolactone (ALDACTONE) 25 MG tablet Take 25 mg by mouth daily.      . valsartan (DIOVAN) 320 MG tablet Take 320 mg by mouth daily.      Marland Kitchen. amLODipine (NORVASC) 5 MG tablet Take 5 mg by mouth daily.      Marland Kitchen. telmisartan (MICARDIS) 80 MG tablet Take 80 mg by mouth daily.       No current facility-administered medications for this visit.    Review of Systems : See HPI for pertinent positives and negatives.  Physical Examination  Filed Vitals:   06/01/13 0943  BP: 131/73  Pulse: 53  Resp: 53   Filed Weights   06/01/13 0943  Weight: 227 lb (102.967 kg)   Body mass index is 33.51 kg/(m^2).  General: WDWN obese male in NAD GAIT: normal Eyes: PERRLA Pulmonary:  Non-labored, CTAB, Negative  Rales, Negative rhonchi, & Negative wheezing.  Cardiac: regular Rhythm ,  Negative detected murmur.  VASCULAR EXAM Carotid Bruits Left Right   Positive, soft Negative     Radial pulses are 2+ palpable and equal.  Gastrointestinal: soft, nontender, BS WNL, no r/g,  negative masses.  Musculoskeletal: Negative muscle atrophy/wasting. M/S 5/5 throughout, Extremities without ischemic changes.  Neurologic: A&O X 3; Appropriate Affect ; SENSATION ;normal;  Speech is normal CN 2-12 intact, Pain and light touch intact in extremities, Motor exam as listed above.   Non-Invasive Vascular Imaging CAROTID DUPLEX 06/01/2013   CEREBROVASCULAR DUPLEX EVALUATION    INDICATION: Carotid artery disease     PREVIOUS INTERVENTION(S): Right carotid endarterectomy 04/29/2006. Left carotid endarterectomy 12/09/2006.    DUPLEX EXAM:     RIGHT  LEFT  Peak Systolic Velocities (cm/s) End Diastolic Velocities (cm/s) Plaque LOCATION Peak Systolic Velocities (cm/s) End Diastolic  Velocities (cm/s) Plaque  106 9  CCA PROXIMAL 101 16   129 17  CCA MID 97 16   138 21 HM CCA DISTAL 98 16   125 11  ECA 104 10   138 9  ICA PROXIMAL 46 9   65 14  ICA MID 39 10   76 19  ICA DISTAL 31 7      ICA / CCA Ratio (PSV)   Antegrade  Vertebral Flow Antegrade   125 Brachial Systolic Pressure (mmHg) 125  Triphasic  Brachial Artery Waveforms Triphasic     Plaque Morphology:  HM = Homogeneous, HT = Heterogeneous, CP = Calcific Plaque, SP = Smooth Plaque, IP = Irregular Plaque     ADDITIONAL FINDINGS:     IMPRESSION: Patent bilateral carotid endarterectomy sites with evidence of mild hyperplasia observed in the right proximal patch site.     Compared to the previous exam:  No significant change in comparison to the last exam on 04/27/2012.      Assessment: [name] is a 73 y.o. male  Who is status post right CEA in March 2008, left CEA in October 2008. He has no recent or remote history of stroke or TIA. Patent bilateral carotid endarterectomy sites with evidence of mild hyperplasia observed in the right proximal patch site.  No significant change in comparison to the last exam on 04/27/2012.   Plan: Follow-up in 1 year with Carotid Duplex scan.   I discussed in depth with the patient the nature of atherosclerosis, and emphasized the importance of maximal medical management including strict control of blood pressure, blood glucose, and lipid levels, obtaining regular exercise, and continued cessation of smoking.  The patient is aware that without maximal medical management the underlying atherosclerotic disease process will progress, limiting the benefit of any interventions. The patient was given information about stroke prevention and what symptoms should prompt the patient to seek immediate medical care. Thank you for allowing [place] to participate in this patient's care.  Charisse March, RN, MSN, FNP-C [facility] Office:  [phone]  Clinic Physician: Hart Rochester on call  06/01/2013 9:34 AM

## 2013-06-01 NOTE — Patient Instructions (Signed)

## 2013-09-26 ENCOUNTER — Ambulatory Visit: Payer: Self-pay | Admitting: Urology

## 2013-12-20 ENCOUNTER — Ambulatory Visit: Payer: Self-pay | Admitting: Urology

## 2013-12-20 LAB — PROTIME-INR
INR: 1
Prothrombin Time: 13.3 secs (ref 11.5–14.7)

## 2013-12-20 LAB — APTT: ACTIVATED PTT: 29.9 s (ref 23.6–35.9)

## 2013-12-22 LAB — URINE CULTURE

## 2013-12-26 ENCOUNTER — Ambulatory Visit: Payer: Self-pay | Admitting: Urology

## 2014-03-13 ENCOUNTER — Ambulatory Visit: Payer: Self-pay | Admitting: Hospice and Palliative Medicine

## 2014-03-15 ENCOUNTER — Encounter: Payer: Self-pay | Admitting: *Deleted

## 2014-03-18 ENCOUNTER — Ambulatory Visit: Payer: Self-pay | Admitting: Family Medicine

## 2014-05-31 ENCOUNTER — Encounter: Payer: Self-pay | Admitting: Family

## 2014-06-03 ENCOUNTER — Ambulatory Visit (INDEPENDENT_AMBULATORY_CARE_PROVIDER_SITE_OTHER): Payer: Medicare Other | Admitting: Family

## 2014-06-03 ENCOUNTER — Other Ambulatory Visit: Payer: Self-pay | Admitting: *Deleted

## 2014-06-03 ENCOUNTER — Ambulatory Visit (HOSPITAL_COMMUNITY)
Admission: RE | Admit: 2014-06-03 | Discharge: 2014-06-03 | Disposition: A | Discharge status: Home / Self Care | Payer: Medicare Other | Source: Ambulatory Visit | Attending: Family | Admitting: Family

## 2014-06-03 ENCOUNTER — Encounter: Payer: Self-pay | Admitting: Family

## 2014-06-03 VITALS — BP 107/59 | HR 44 | Resp 14 | Ht 69.0 in | Wt 237.0 lb

## 2014-06-03 DIAGNOSIS — I6523 Occlusion and stenosis of bilateral carotid arteries: Secondary | ICD-10-CM | POA: Diagnosis not present

## 2014-06-03 DIAGNOSIS — Z48812 Encounter for surgical aftercare following surgery on the circulatory system: Secondary | ICD-10-CM

## 2014-06-03 DIAGNOSIS — Z9889 Other specified postprocedural states: Secondary | ICD-10-CM | POA: Insufficient documentation

## 2014-06-03 DIAGNOSIS — Z87891 Personal history of nicotine dependence: Secondary | ICD-10-CM

## 2014-06-03 NOTE — Progress Notes (Signed)
Established Carotid Patient   History of Present Illness  [name] is a 74 y.o. male patient of Dr. Myra GianottiBrabham who is status post right CEA in March 2008, left CEA in October 2008. He returns today for follow up. He gets 4 hours walking into his daily routine, 5 days/week, also plays golf. He denies claudication symptoms in his legs with walking. 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  Patient has Negative history of TIA or stroke symptom. The patient denies amaurosis fugax or monocular blindness. The patient denies facial drooping. Pt. denies hemiplegia. The patient denies receptive or expressive aphasia. Pt. denies extremity weakness. He denies tingling, numbness, weakness, or pain in either hand/arm, denies dizziness.  Patient reports New Medical or Surgical History: he had a urinary tract blockage which was corrected in 2015.  Pt Diabetic: No Pt smoker: former smoker, quit 22 years ago  Pt meds include: Statin : Yes ASA: Yes Other anticoagulants/antiplatelets: Plavix started after the CABG  Past Medical History  Diagnosis Date  . Carotid artery occlusion   . Renal artery stenosis   . Hypertension   . Coronary artery disease   . Atrial fibrillation   . CHF (congestive heart failure)     Social History History  Substance Use Topics  . Smoking status: Former Smoker    Types: Cigarettes    Quit date: 02/23/1992  . Smokeless tobacco: Never Used  . Alcohol Use: No    Family History Family History  Problem Relation Age of Onset  . Heart disease Mother     Before age 72  . Heart attack Mother   . Cancer Mother     Breast  . Hyperlipidemia Mother   . Hypertension Mother   . Diabetes Mother   . Heart disease Father     Heart Disease before age 72  . Heart attack Father   . Hyperlipidemia Father   . Hypertension Father   . Cancer Sister     Breast  . Heart disease Sister     Before age 72  . Hyperlipidemia Sister   .  Hypertension Sister   . Heart attack Sister   . Diabetes Sister   . Cancer Daughter     Colon    Surgical History Past Surgical History  Procedure Laterality Date  . Carotid endarterectomy  04/29/2006    right  . Carotid endarterectomy  12/09/2006    left  . Coronary artery bypass graft  2008  . Cholecystectomy  2012    Gall Bladder    No Known Allergies  Current Outpatient Prescriptions  Medication Sig Dispense Refill  . amLODipine (NORVASC) 5 MG tablet Take 5 mg by mouth daily.    Marland Kitchen. aspirin 81 MG tablet Take 81 mg by mouth daily.    . carvedilol (COREG) 12.5 MG tablet daily.    . clopidogrel (PLAVIX) 75 MG tablet Take 75 mg by mouth daily.    . finasteride (PROSCAR) 5 MG tablet daily.    . rosuvastatin (CRESTOR) 10 MG tablet Take 40 mg by mouth daily.     Marland Kitchen. spironolactone (ALDACTONE) 25 MG tablet Take 25 mg by mouth daily.    Marland Kitchen. telmisartan (MICARDIS) 80 MG tablet Take 80 mg by mouth daily.    . valsartan (DIOVAN) 320 MG tablet Take 320 mg by mouth daily.     No current facility-administered medications for this visit.    Review of Systems : See HPI for  pertinent positives and negatives.  Physical Examination  Filed Vitals:   06/03/14 0953 06/03/14 1003  BP: 115/63 107/59  Pulse: 45 44  Resp:  14  Height:   (1.753 m)  Weight:  237 lb (107.502 kg)  SpO2:  98%   Body mass index is 34.98 kg/(m^2).  General: WDWN obese male in NAD GAIT: normal Eyes: PERRLA Pulmonary: Non-labored, CTAB, Negative Rales, Negative rhonchi, & Negative wheezing.  Cardiac: regular Rhythm, no detected murmur.  VASCULAR EXAM Carotid Bruits Left Right   Positive, soft positive    Radial pulses are 2+ palpable and equal.     Gastrointestinal: soft, nontender, BS WNL, no r/g, no palpable masses.  Musculoskeletal: Negative muscle  atrophy/wasting. M/S 5/5 throughout, Extremities without ischemic changes.  Neurologic: A&O X 3; Appropriate Affect, Speech is normal CN 2-12 intact, Pain and light touch intact in extremities, Motor exam as listed above.          Non-Invasive Vascular Imaging CAROTID DUPLEX 06/03/2014   CEREBROVASCULAR DUPLEX EVALUATION    INDICATION: Follow-up carotid disease     PREVIOUS INTERVENTION(S): Right carotid endarterectomy 04/29/2006 Left carotid endarterectomy 12/09/2006    DUPLEX EXAM:     RIGHT  LEFT  Peak Systolic Velocities (cm/s) End Diastolic Velocities (cm/s) Plaque LOCATION Peak Systolic Velocities (cm/s) End Diastolic Velocities (cm/s) Plaque  91 13  CCA PROXIMAL 96 18   155 22  CCA MID 103 19   161 20 HM CCA DISTAL 109 22   191 13  ECA 109 10   137 14  ICA PROXIMAL 38 11   79 17  ICA MID 104 31   90 22  ICA DISTAL 70 23     NA ICA / CCA Ratio (PSV) NA  Antegrade (mild dampening) Vertebral Flow Antegrade    Brachial Systolic Pressure (mmHg)   Biphasic  Brachial Artery Waveforms Within normal limits     Plaque Morphology:  HM = Homogeneous, HT = Heterogeneous, CP = Calcific Plaque, SP = Smooth Plaque, IP = Irregular Plaque  ADDITIONAL FINDINGS:     IMPRESSION: 1. Patent right carotid endarterectomy with minimal hyperplasia at the proximal patch site. 2. Widely patent left carotid endarterectomy without evidence of restenosis or hyperplasia.  3. Bilateral vertebral artery is antegrade with slight dampening of the right. 4. 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 Origin of artery cannot be visualized due to body habitus.    Compared to the previous exam:  No significant change of the bilateral internal carotid artery compared to prior exam.       Assessment: Offie Waide is a 74 y.o. male who is status post right CEA in March 2008, left CEA in October 2008. He has no history of stroke or TIA. Today's carotid  Duplex suggests a patent right carotid endarterectomy with minimal hyperplasia at the proximal patch site. Widely patent left carotid endarterectomy without evidence of restenosis or hyperplasia.  Bilateral vertebral artery is antegrade with slight dampening of the right. 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 Origin of artery cannot be visualized due to body habitus. No significant change of the bilateral internal carotid artery compared to prior Duplex.   Plan: Follow-up in 1 year with Carotid Duplex.   I discussed in depth with the patient the nature of atherosclerosis, and emphasized the importance of maximal medical management including strict control of blood pressure, blood glucose, and lipid levels, obtaining regular exercise, and  continued cessation of smoking.  The patient is aware that without maximal medical management the underlying atherosclerotic disease process will progress, limiting the benefit of any interventions. The patient was given information about stroke prevention and what symptoms should prompt the patient to seek immediate medical care. Thank you for allowing [place] to participate in this patient's care.  Charisse March, RN, MSN, FNP-C [facility] Office: [phone]  Clinic Physician: Myra Gianotti  06/03/2014 10:25 AM

## 2014-06-03 NOTE — Patient Instructions (Signed)
Stroke Prevention Some medical conditions and behaviors are associated with an increased chance of having a stroke. You may prevent a stroke by making healthy choices and managing medical conditions. HOW CAN I REDUCE MY RISK OF HAVING A STROKE?   Stay physically active. Get at least 30 minutes of activity on most or all days.  Do not smoke. It may also be helpful to avoid exposure to secondhand smoke.  Limit alcohol use. Moderate alcohol use is considered to be:  No more than 2 drinks per day for men.  No more than 1 drink per day for nonpregnant women.  Eat healthy foods. This involves:  Eating 5 or more servings of fruits and vegetables a day.  Making dietary changes that address high blood pressure (hypertension), high cholesterol, diabetes, or obesity.  Manage your cholesterol levels.  Making food choices that are high in fiber and low in saturated fat, trans fat, and cholesterol may control cholesterol levels.  Take any prescribed medicines to control cholesterol as directed by your health care provider.  Manage your diabetes.  Controlling your carbohydrate and sugar intake is recommended to manage diabetes.  Take any prescribed medicines to control diabetes as directed by your health care provider.  Control your hypertension.  Making food choices that are low in salt (sodium), saturated fat, trans fat, and cholesterol is recommended to manage hypertension.  Take any prescribed medicines to control hypertension as directed by your health care provider.  Maintain a healthy weight.  Reducing calorie intake and making food choices that are low in sodium, saturated fat, trans fat, and cholesterol are recommended to manage weight.  Stop drug abuse.  Avoid taking birth control pills.  Talk to your health care provider about the risks of taking birth control pills if you are over 35 years old, smoke, get migraines, or have ever had a blood clot.  Get evaluated for sleep  disorders (sleep apnea).  Talk to your health care provider about getting a sleep evaluation if you snore a lot or have excessive sleepiness.  Take medicines only as directed by your health care provider.  For some people, aspirin or blood thinners (anticoagulants) are helpful in reducing the risk of forming abnormal blood clots that can lead to stroke. If you have the irregular heart rhythm of atrial fibrillation, you should be on a blood thinner unless there is a good reason you cannot take them.  Understand all your medicine instructions.  Make sure that other conditions (such as anemia or atherosclerosis) are addressed. SEEK IMMEDIATE MEDICAL CARE IF:   You have sudden weakness or numbness of the face, arm, or leg, especially on one side of the body.  Your face or eyelid droops to one side.  You have sudden confusion.  You have trouble speaking (aphasia) or understanding.  You have sudden trouble seeing in one or both eyes.  You have sudden trouble walking.  You have dizziness.  You have a loss of balance or coordination.  You have a sudden, severe headache with no known cause.  You have new chest pain or an irregular heartbeat. Any of these symptoms may represent a serious problem that is an emergency. Do not wait to see if the symptoms will go away. Get medical help at once. Call your local emergency services (911 in [place] Do not drive yourself to the hospital. Document Released: 03/18/2004 Document Revised: 06/25/2013 Document Reviewed: 08/11/2012 Exit[employer] Patient Information 2015 [employer]. This information is not intended to replace advice given   to you by your health care provider. Make sure you discuss any questions you have with your health care provider.  

## 2014-06-14 NOTE — Consult Note (Signed)
CC: LGI bleed.  Pt without fresh bleeding, some dark material in last bowel movement.  No abd pain, vomiting,  VSS afebrile, hgb 12.7, unchanged.  I think he can take clear liq today and full liquid tomorrow morning and noon and if does well go home late tomorrow afternoon and follow up in office with me next week.  Electronic Signatures: Scot JunElliott, Robert T (MD)  (Signed on 29-Apr-14 12:44)  Authored  Last Updated: 29-Apr-14 12:44 by Scot JunElliott, Robert T (MD)

## 2014-06-14 NOTE — Discharge Summary (Signed)
PATIENT NAME:  [name], [name] MR#:  [id number] DATE OF BIRTH:  Dec 17, 1940  DATE OF ADMISSION:  06/19/2012 DATE OF DISCHARGE:  06/21/2012  REASON FOR ADMISSION: Bright red blood per rectum clear.   DISCHARGE DIAGNOSES: 1.  Lower gastrointestinal bleeding due to diverticular leak. 2.  History of coronary artery disease. 3. Hyperlipidemia.  4.  Hypertension.  5.  History of cardiomyopathy.   MEDICATIONS AT DISCHARGE:  Crestor 40 mg once a day, Diovan 320 mg once a day, spironolactone 25 mg once daily, Protonix 40 daily, aspirin 81 mg once a day to be restarted on 06/23/2012, Plavix 75 mg once a day to be raised starting on 06/29/2012.  Home with home health.  FOLLOW UP:  Follow up with Dr. Mechele Collin at the GI clinic in the next 7 days and with Dr. Dossie Arbour or nurse practitioner Gabriel Cirri in the next 1 to 2 weeks.  PERTINENT LABORATORY AND DIAGNOSTIC DATA:  The hemoglobin on admission was 14.1.  The following morning it dropped to 12.9, then 12.2.  The last hemoglobin at discharge was 11.9, platelets 133, white count 307 on discharge.  INR 1.0.  Urinalysis without any signs of infection. LFTs within normal limits. Glucose 79, creatinine 1.14, BUN of 23 on admission.  HOSPITAL COURSE:  The patient is a very nice 74 year old gentleman who has a history of coronary artery disease, diagnosed with cardiomyopathy in the past.  He was admitted on 06/19/2012 with a history of bright red blood per rectum.  The patient reportedly had several episodes morning the morning prior to admission, about 8 bowel movements.  He denies any previous history of GI bleeding.  He had a colonoscopy showing hemorrhoids in 2012. He denies any significant weight loss or weight gain or masses in his abdomen.  He has a history of renal mass, being on Plavix for a long time due to his significant coronary artery disease. He also had ischemic cardiomyopathy with an ejection fraction of 35% by the last cardiac catheterization.   The patient as well has renal artery stenosis, peripheral vascular disease, carotid artery disease status post right carotid endarterectomy, hypertension, hyperlipidemia, he is obese and he is a former tobacco user.  During the evaluation in the ER, the patient was bradycardic with a pulse of 50s, respiration rate of 18, blood pressure 126/62.  Then he has some decrease of his blood pressures of low 100s over 60s. His hemoglobin on admission was 14 and has dropped during that hospitalization.  PROBLEMS:  1.  He had  lower GI bleeding with bright red blood per rectum for which the patient was kept n.p.o. for a possible procedure. Gastrointestinal evaluation was done. Dr. Mechele Collin did not feel the need to do a colonoscopy as he agreed that by reviewing his data that the patient has a diverticular bleed. He wanted to have very conservative management, advancing his diet very slowly and monitoring his hemoglobin very closely. He did have a red blood cell tag scan without any signs of acute bleeding. Next he was overall hemodynamically stable. His diet was advanced very slowly.  The patient was able to tolerated a diet and his stool cleared up from blood and the patient was discharged.  The patient needs to follow up with Dr. Lynnae Prude within the next 7 days for possibly needing outpatient management like a colonoscopy. The patient did very well during this hospitalization. 2.  As far as his other medical problems, he has coronary artery disease. He takes Plavix  and aspirin. He has been told to stop the Plavix until 06/28/2012 and the aspirin for only 2 more days just to prevent any breakdown of fresh blood clots.  3.  Aside from cardiomyopathy, the patient did not have any issues with volume.  He was not fluid overloaded.  His cardiomyopathy is systolic CHF but he was compensated.  4.  His blood pressure stable.  At the beginning, it was on the low side, for which the patient was holding his blood pressure  medication.  Now his blood pressure is fine at discharge, for which he is able to start them back again.   TIME SPENT: I spent about 35 minutes with this discharge of this patient.   ____________________________ Felipa Furnaceoberto Sanchez Gutierrez, MD rsg:ct D: 06/23/2012 16:56:08 ET T: 06/24/2012 07:40:28 ET JOB#: [id number]  cc: Felipa Furnaceoberto Sanchez Gutierrez, MD, <Dictator> Steele SizerMark A. Crissman, MD Phillips Odorheryl A. Jamesetta OrleansWicker, NP Regan RakersOBERTO Juanda ChanceSANCHEZ GUTIERRE MD ELECTRONICALLY SIGNED 06/24/2012 11:14

## 2014-06-14 NOTE — H&P (Signed)
PATIENT NAME:  [name], [name] MR#:  [id number] DATE OF BIRTH:  1940/08/27  DATE OF ADMISSION:  06/19/2012  REFERRING PHYSICIAN: Dr. Clemens Catholicagsdale   PRIMARY CARE PHYSICIAN: Dr. Jamesetta OrleansWicker   CHIEF COMPLAINT: Bright red blood per rectum.   HISTORY OF PRESENT ILLNESS: This is a 74 year old male with significant past medical history of coronary artery disease, peripheral vascular disease, cardiomyopathy, on aspirin and Plavix, who presents with complaints of bright red blood per rectum. The patient reports these episodes started yesterday morning; so far had total of eight episodes. Last couple were pure bright blood per rectum. Denies any previous history of GI bleed, either upper or lower. The patient's hemoglobin was 14.1. Denies any dizziness or lightheadedness. The patient had nuclear medicine bleeding scan done in ED which was negative for any evidence of GI bleed. The patient had last colonoscopy by Dr. Lurline DelShaukat Iftikhar done in February 2012 which did show evidence of internal hemorrhoids. The patient reports he had last episode of normal bowel movement yesterday. He has complaints of mild nausea, but denies any constipation, any diarrhea, any vomiting, any dizziness, lightheadedness, any chest pain, any shortness of breath, any coffee-ground emesis or epigastric pain. As well, denies any recent NSAID use. Hospitalist service was requested to admit the patient for further management and work-up of his bright red blood per rectum.   PAST MEDICAL HISTORY: 1. Coronary artery disease.  2. Cardiomyopathy with ejection fraction of 35% by cardiac catheterization.  3. Renal artery stenosis.  4. Peripheral vascular disease.  5. Carotid artery disease status post right carotid endarterectomy.  6. Hypertension.  7. Hyperlipidemia.  8. Morbid obesity.  9. Ex-tobacco user.   SOCIAL HISTORY: Lives at home with his wife. No history of smoking or alcohol use.   FAMILY HISTORY: Significant for hypertension.    ALLERGIES: No known drug allergies.   HOME MEDICATIONS: 1. Aldactone 25 mg oral daily.  2. Diovan 320 mg oral daily.  3. Crestor 40 mg oral at bedtime.  4. Aspirin 81 mg daily.  5. Plavix 75 mg oral daily.   REVIEW OF SYSTEMS:  CONSTITUTIONAL: Denies any fever, chills, fatigue, weakness.  EYES: Denies blurry vision, double vision, glaucoma or inflammation.  ENT: Denies tinnitus, ear pain, hearing loss, epistaxis.  RESPIRATORY: Denies cough, wheezing, hemoptysis, dyspnea, chronic obstructive pulmonary disease.  CARDIOVASCULAR: Denies chest pain, orthopnea, edema, arrhythmia, palpitations, syncope.  GASTROINTESTINAL: Denies any abdominal pain, diarrhea, constipation, vomiting, hematemesis, coffee-ground emesis or melena. Complains of bright red blood per rectum and mild nausea.  GENITOURINARY: Denies dysuria, hematuria, renal colic.  ENDOCRINE: Denies polyuria, polydipsia, heat or cold intolerance.  HEMATOLOGY: Denies any anemia, bleeding diathesis. Reports he has easy bruising.  INTEGUMENTARY: Denies acne, rash or skin lesions.  MUSCULOSKELETAL: Denies neck pain, shoulder pain, arthritis or gout.  NEUROLOGIC: Denies numbness, dysarthria, epilepsy, tremors, vertigo, CVA, transient ischemic attack or memory loss.  PSYCHIATRIC: Denies anxiety, insomnia, bipolar disorder or schizophrenia.   PHYSICAL EXAMINATION: VITAL SIGNS: Temperature 97.7, pulse 51, respiratory rate 18, blood pressure 126/62, saturating 98% on room air.  GENERAL: Well-nourished male, looks comfortable, in no apparent distress.  HEENT: Head atraumatic, normocephalic. Pupils reactive to light. Pink conjunctivae. Anicteric sclera. Moist oral mucosa.  NECK: Supple. No thyromegaly. No JVD.  CHEST: Good air entry bilaterally. No wheezing, rales, rhonchi.  CARDIOVASCULAR: S1, S2 heard. No rubs, murmurs, or gallops.  ABDOMEN: Soft, nontender, nondistended. Bowel sounds present.  EXTREMITIES: No edema. No clubbing. No  cyanosis. Dorsalis pedis pulse +2 bilaterally.  SKIN:  Normal skin turgor. Warm and dry. Has multiple bruises in upper and lower extremity. The patient reports easy bruising.  NEUROLOGIC: Grossly intact. Motor 5/5.  PSYCHIATRIC: Appropriate affect. Awake, alert x3. Intact judgment and insight.   PERTINENT LABS: Glucose 87, BUN 23, creatinine 1.26, sodium 140, potassium 4.6, chloride 109, CO2 27. White blood cells 7, hemoglobin 14.1, hematocrit 41, platelets 177. INR 1, PTT 28.3. Urinalysis negative for leukocyte esterase and nitrite.   IMAGING: Had bleeding scan, which shows no evidence of GI bleed.   ASSESSMENT AND PLAN: 1. Lower gastrointestinal bleed. The patient has bright red blood per rectum, so this is most likely lower GI bleed. Etiology is unclear, but the patient has negative gastrointestinal bleeding scan. The last colonoscopy in 2012 showing internal hemorrhoids, so it might be likely the source of his bleed. No history of diverticulosis. The patient will be admitted. We will check his hemoglobin every eight hours. Already typed and screened and we will transfuse if significant drop in his hemoglobin or he becomes symptomatic or hypotensive. We will have him on IV Protonix 40 mg b.i.d. We will keep him n.p.o. We will hold his blood thinners, aspirin and Plavix. As well, we will hold diuresis and hypertensive medication until he is more stable. Will consult GI service, Dr. Lurline Del, since he is the one who did his last colonoscopy.  2. History of coronary artery disease. The patient does not have any complaints of chest pain or shortness of breath. We will resume his aspirin and Plavix when he is more stable and bleeding resolves.  3. Hyperlipidemia. Continue with statin.  4. Hypertension, acceptable. We will hold home meds until he is stable.  5. History of cardiomyopathy. We will hold Aldactone. We will have the patient on gentle hydration. Does not appear to be in any volume  overload.  6. Deep vein thrombosis prophylaxis with sequential compression devices. We will avoid chemical anticoagulation secondary to his bright red blood per rectum.   CODE STATUS: The patient is FULL CODE.   Total time spent on admission and patient care: 55 minutes.    ____________________________ Starleen Arms, MD dse:aw D: 06/19/2012 01:06:35 ET T: 06/19/2012 05:36:53 ET JOB#: [id number]  cc: Starleen Arms, MD, <Dictator> Willene Holian Teena Irani MD ELECTRONICALLY SIGNED 06/20/2012 6:19

## 2014-06-14 NOTE — Consult Note (Signed)
Brief Consult Note: Diagnosis: rectal bleeding.   Consult note dictated.   Discussed with Attending MD.   Comments: Patient seen and examined. Rectal bleeding x24-48 hours, multiple episodes without accompanying stool. Denies diarrhea, constipation, abdominal pain, rectal pain, fever, chills, recent straining. Denies melena. Last colonoscopy feb 2012 notable for internal hemorrhoids. No hx of diverticulosis. Slight decline in hgb overnight. Tagged RBC negative. Has not rebled since ER. full consult being dictation.  Electronic Signatures: Brantley StageEarle, Uchenna Rappaport M (PA-C)  (Signed 28-Apr-14 12:22)  Authored: Brief Consult Note   Last Updated: 28-Apr-14 12:22 by Ashok CordiaEarle, Shawndrea Rutkowski M (PA-C)

## 2014-06-14 NOTE — Consult Note (Signed)
Pt CC LGI bleed.  Pt without bleeding today.  Tol full liq well, on mashed potatoes now.  Passed some old dark stool and it was lightening up some.  I think he can go home on a low residue diet and follow up with me in a week or two.   Electronic Signatures: Scot JunElliott, Robert T (MD)  (Signed on 30-Apr-14 14:07)  Authored  Last Updated: 30-Apr-14 14:07 by Scot JunElliott, Robert T (MD)

## 2014-06-14 NOTE — Consult Note (Signed)
PATIENT NAME:  [name], [name] MR#:  [id number] DATE OF BIRTH:  1940-10-28  Dictated for Lynnae Prudeobert Elliott, MD  DATE OF CONSULTATION:  06/19/2012  REFERRING PHYSICIAN: Dr. Randol KernElgergawy.  ATTENDING GASTROENTEROLOGIST: Dr. Lynnae Prudeobert Elliott.  REASON FOR CONSULT: Bright red blood per rectum.   HISTORY OF PRESENT ILLNESS: This is a pleasant 74 year old gentleman who initially presented to the Emergency Department with concerns of rectal bleeding. He states the onset was early Sunday morning at about 3:00 a.m. when he woke up with the sensation that he needed to have a bowel movement. He went and sat down, and ended up having blood per rectum. Blood was described as obvious red, fresh blood, with accompanying clots, and there was no stool with this movement.   Over the past 36 hours or so he has had recurrent episodes, between 8 and 10, of bright red blood, and has not passed a stool at all with this. He denies any accompanying abdominal or rectal pain. There is no lightheadedness or dizziness. There is no nausea, vomiting, heartburn indigestion or dysphagia. No unintentional weight changes. The patient states he has never had this happen in the past.   His last colonoscopy was February 2012 by Dr. Niel HummerIftikhar, notable for small internal hemorrhoids. No mention of colonic polyps or diverticula at this time. Prior colonoscopy, however, the patient does report he has been found to have colon polyps.   In the ER, his hemoglobin was 14.1, and this morning it was redrawn and it had declined slightly to 12.9. He states he has not re-bled since he was in the Emergency Room. A tagged red blood cell scan was obtained and was negative for active bleeding. He denies any recent NSAID use. He does have an appetite. No chest pain or shortness of breath.   ALLERGIES: No known drug allergies.   PAST MEDICAL HISTORY: Dyslipidemia, hypertension, coronary artery disease, peripheral vascular disease, cardiomyopathy with an  ejection fraction of 35%, history of renal artery stenosis.   PAST SURGICAL HISTORY: CABG, gallbladder surgery and carotid endarterectomy.   HOME MEDICATIONS: Aspirin, Plavix, Aldactone, Diovan and Crestor.   FAMILY HISTORY: Negative for GI malignancy or IBD.   SOCIAL HISTORY: Denies current alcohol, tobacco or illicit drug use. There is a remote history of tobacco use.   REVIEW OF SYSTEMS: A 10-systems review of systems was obtained on the patient. All pertinent positives are mentioned above, and otherwise negative.   OBJECTIVE: VITAL SIGNS: Blood pressure 108/61, heart rate 52, respirations 21, temp 97.8, bedside pulse ox is 98% on room air.  GENERAL: This is a pleasant 74 year old gentleman resting quietly and comfortably in bed, accompanied by a family member. Alert and oriented x 3, in no acute distress.  HEAD: Atraumatic, normocephalic.  NECK: Supple. No lymphadenopathy noted.  HEENT: Sclerae anicteric. Mucous membranes moist.  PULMONARY: Respirations are even and unlabored, clear to auscultation in bilateral anterior lung fields.  CARDIAC: Regular rate and rhythm. S1, S2 noted.  ABDOMEN: Soft, nontender, nondistended. Normoactive bowel sounds noted in all 4 quadrants. No masses palpated. No guarding or rebound.  PSYCHIATRIC: Appropriate mood and affect.   IMAGING: A tagged red blood cell nuclear bleeding scan was obtained on the patient and was negative for an active bleed.   ASSESSMENT: 1.  Rectal bleeding x 36 hours. The patient has not re-bled since he was in the Emergency Room. Hemoglobin was initially 14.1, and has slightly declined to 12.9 overnight.  2.  History of internal hemorrhoids noted on colonoscopy in  2012.  3. History of coronary artery disease, status post coronary artery bypass grafting. The patient is currently on aspirin and Plavix therapy.   PLAN: I have discussed this patient's case in detail with Dr. Lynnae Prude, who was involved in the development of  the patient's plan of care. At this time the patient's vitals are remaining stable and his hemoglobin is 12.9.   We do agree with him continuing to hold aspirin and Plavix at this time.   Differential diagnoses could include, but are not limited to: Internal hemorrhoidal bleed, diverticular bleed, colonic polyp versus, less likely a neoplasm.   At this point because the patient has not re-bled since he was in the Emergency Room we are comfortable with extending his serial hemoglobins to checking q.12 h. We are comfortable with him having water and ice chips today and advancing him to clear liquids in the morning. We do agree with him being on Protonix therapy for GI prophylaxis. We also recommend continuing to hold aspirin and Plavix.  At discharge we are comfortable with him resuming aspirin, but we would like to consider the option of holding his Plavix beyond this. Should he remain stable we are comfortable following up with him in the office for future management. However, we will continue to monitor him closely and make further recommendations per clinical course.   The above was discussed with the patient, who verbalized understanding and all questions were answered.   The above was discussed and agreed upon under supervisory agreement between myself and  Dr. Lynnae Prude. Thank you so much for this consultation and for allowing [place] to participate in the patient's plan of care.     ____________________________ Hardie Shackleton. Dawsen Krieger, PA-C kme:dm D: 06/19/2012 13:38:19 ET T: 06/19/2012 13:50:15 ET JOB#: [id number]  cc: Hardie Shackleton. Ulani Degrasse, PA-C, <Dictator> Hardie Shackleton Omega Slager PA ELECTRONICALLY SIGNED 06/19/2012 15:54

## 2014-06-14 NOTE — Consult Note (Signed)
CC: LGI bleed.  Pt with painless rectal bleeding which has stopped for 12 hours or more.  On water only at this time.  Can advance to clear liq tomorrow and full liquid Wed and discharge Wed afternoon if no further bleeding. He had a colonoscopy 2 years ago and another in 2007.  No pain at this time.  Most likely was diverticular bleeding, possible signif hemorrhoids could be cause.  doubt colon neoplasm but not impossible.  Will follow with you.  Electronic Signatures: Scot JunElliott, Sieanna Vanstone T (MD)  (Signed on 28-Apr-14 17:44)  Authored  Last Updated: 28-Apr-14 17:44 by Scot JunElliott, Joetta Delprado T (MD)

## 2014-06-15 NOTE — Op Note (Signed)
PATIENT NAME:  [name], [name] MR#:  [id number] DATE OF BIRTH:  [birth year]  DATE OF PROCEDURE:  12/26/2013  PREOPERATIVE DIAGNOSIS: Phimosis, urethral stricture, hematuria.   POSTOPERATIVE DIAGNOSES: Phimosis, urethral stricture, hematuria.   PROCEDURE PERFORMED: Retrograde urethrogram, circumcision, direct visual internal urethrotomy, and cystoscopy.   ATTENDING SURGEON: Claris Gladden, MD.   ANESTHESIA: General anesthesia.   INTRAVENOUS FLUIDS:  See anesthesia note.  ESTIMATED BLOOD LOSS: Minimal.   DRAINS: A 20 French Foley catheter (Councill tip).   SPECIMENS: None.   COMPLICATIONS: None.   INDICATION: This is a 74 year old male with a history of phimosis and hematuria. He underwent hematuria work-up including CT urogram, which was negative. In the office attempted cystoscopy revealed a fairly significant urethral stricture in the mid anterior urethra and cystoscopy was unable to be completed. At that time he was counseled to undergo dilation versus DVIU of his urethral stricture, cystoscopy and circumcision at the same time if desired. He was made aware of the risks of recurrent urethral stricture. Risks and benefits of the procedure were explained in detail. The patient agreed to proceed as planned.   DESCRIPTION OF PROCEDURE: The patient was correctly identified in the preoperative holding area and informed consent was obtained. He was brought to the operating suite and placed on the table in the supine position. At this time a universal timeout protocol was performed. All team members were identified. Venodyne boots were placed and he was administered 2 grams of IV Ancef in the perioperative period. He was then placed under general anesthesia and repositioned in the left lateral decubitus position with a pillow placed under the pelvis to oblique this area. Betadine solution was used then to clean the glans and a retrograde urethrogram was then performed using fluoroscopy at which  time diluted Optiray was injected per urethral meatus using a catheter-tip syringe. This revealed 2 strictures, 1 in the mid urethra shaft, which was fairly narrow but rather short in length, less than 1 cm, and another mild bulbar urethral stricture. There was some proximal dilation in the posterior urethra and he was then repositioned in the dorsal lithotomy position and prepped and draped in the standard surgical fashion.   At this point in time, the circumcision was then performed. A dorsal penile block as well as a ring block was performed using a total of 10 mL of 1% lidocaine. Two ring marks were made, 1 in the mid shaft and 1 approximately 1 cm proximal to the coronal margin at the site of the planned incision. A 15 blade was then used to incise these 2 marks. The foreskin was then removed in a sleeve-like fashion using Bovie electrocautery. Adequate hemostasis was achieved then using Bovie electrocautery. The frenulum was then reconstructed using a series of interrupted 3-0 chromic sutures. A U-stitch was placed at the ventral aspect approximating the shaft skin to the subcoronal mucosa. The remainder of the penile shaft skin was then reapproximated using a series of interrupted 3-0 chromic sutures. The cosmetic outcome was very good.   At this point, a urethrotome scope using the 0-degrees lens was then advanced per urethral into the mid shaft where a fairly tight but not very dense-appearing stricture was encountered. The blade was used to incise at the 12 o'clock position and open this up quite easily. Second, the bulbar urethral stricture was also encountered and incised. At this point in time, the scope could be easily advanced into the bladder. The bladder was carefully inspected using both the  30-degree and the 70-degree lenses. This revealed normal mucosa with some slight inflammation in the mid trigone. The bilateral ureteral orifices were identified and noted to have clear efflux of urine.  There was no significant trabeculation or diverticula noted. No bladder stones.   At this point in time the scope was then removed and the 20 French Councill tip catheter was advanced easily per urethra into the bladder. The bladder was then drained after the balloon was inflated with 10 mL of sterile water. A circumcision dressing was then applied using Vaseline gauze, Coban and Conform.  The catheter was then secured to the patient's left thigh. The patient was reversed from anesthesia after being repositioned in the supine position, and taken to the PACU in stable condition. There were no complications in this case.    ____________________________ Claris GladdenAshley J. Ingram Onnen, MD ajb:lt D: 12/26/2013 10:57:47 ET T: 12/26/2013 13:47:57 ET JOB#: [id number]  cc: Claris GladdenAshley J. Zayonna Ayuso, MD, <Dictator> Claris GladdenASHLEY J Coryn Mosso MD ELECTRONICALLY SIGNED 01/10/2014 15:18

## 2014-06-27 DIAGNOSIS — N029 Recurrent and persistent hematuria with unspecified morphologic changes: Secondary | ICD-10-CM | POA: Insufficient documentation

## 2014-06-27 DIAGNOSIS — I2510 Atherosclerotic heart disease of native coronary artery without angina pectoris: Secondary | ICD-10-CM | POA: Insufficient documentation

## 2014-06-27 DIAGNOSIS — I1310 Hypertensive heart and chronic kidney disease without heart failure, with stage 1 through stage 4 chronic kidney disease, or unspecified chronic kidney disease: Secondary | ICD-10-CM | POA: Insufficient documentation

## 2014-06-27 DIAGNOSIS — N183 Chronic kidney disease, stage 3 unspecified: Secondary | ICD-10-CM | POA: Insufficient documentation

## 2014-06-27 DIAGNOSIS — E669 Obesity, unspecified: Secondary | ICD-10-CM | POA: Insufficient documentation

## 2014-06-27 DIAGNOSIS — R311 Benign essential microscopic hematuria: Secondary | ICD-10-CM | POA: Insufficient documentation

## 2014-06-27 DIAGNOSIS — I130 Hypertensive heart and chronic kidney disease with heart failure and stage 1 through stage 4 chronic kidney disease, or unspecified chronic kidney disease: Secondary | ICD-10-CM

## 2014-06-27 DIAGNOSIS — R319 Hematuria, unspecified: Secondary | ICD-10-CM

## 2014-07-29 NOTE — Progress Notes (Signed)
Patient referred to have LDCT lung cancer screening by Gabriel Cirriheryl Wicker NP. Has smoking history of 70 pack years with quit date of 14 years ago. Patient attended shared decision making visit with Sharia ReeveJosh Borders NP, was found to meet criteria for screening including being asymptomatic for lung cancer, and had CT screening scan. Notified today of Lung [facility] 2 finding with radiologist recommendation for 12 month follow up imaging.

## 2014-08-16 ENCOUNTER — Ambulatory Visit (INDEPENDENT_AMBULATORY_CARE_PROVIDER_SITE_OTHER): Payer: Medicare Other | Admitting: Unknown Physician Specialty

## 2014-08-16 ENCOUNTER — Encounter: Payer: Self-pay | Admitting: Unknown Physician Specialty

## 2014-08-16 VITALS — BP 116/60 | HR 51 | Temp 97.6°F | Ht 67.2 in | Wt 235.8 lb

## 2014-08-16 DIAGNOSIS — N183 Chronic kidney disease, stage 3 unspecified: Secondary | ICD-10-CM

## 2014-08-16 DIAGNOSIS — E7800 Pure hypercholesterolemia, unspecified: Secondary | ICD-10-CM

## 2014-08-16 DIAGNOSIS — I130 Hypertensive heart and chronic kidney disease with heart failure and stage 1 through stage 4 chronic kidney disease, or unspecified chronic kidney disease: Secondary | ICD-10-CM

## 2014-08-16 DIAGNOSIS — I2510 Atherosclerotic heart disease of native coronary artery without angina pectoris: Secondary | ICD-10-CM

## 2014-08-16 LAB — LIPID PANEL PICCOLO, WAIVED
CHOL/HDL RATIO PICCOLO,WAIVE: 2.9 mg/dL
CHOLESTEROL PICCOLO, WAIVED: 96 mg/dL (ref <200)
HDL CHOL PICCOLO, WAIVED: 33 mg/dL — AB (ref >59)
LDL CHOL CALC PICCOLO WAIVED: 34 mg/dL (ref <100)
TRIGLYCERIDES PICCOLO,WAIVED: 147 mg/dL (ref <150)
VLDL Chol Calc Piccolo,Waive: 29 mg/dL (ref <30)

## 2014-08-16 MED ORDER — ROSUVASTATIN CALCIUM 10 MG PO TABS: 40.0000 mg | ORAL_TABLET | Freq: Every day | ORAL | Status: DC

## 2014-08-16 NOTE — Progress Notes (Signed)
BP 116/60 mmHg  Pulse 51  Temp(Src) 97.6 F (36.4 C)  Ht 5' 7.2" (1.707 m)  Wt 235 lb 12.8 oz (106.958 kg)  BMI 36.71 kg/m2  SpO2 96%   Subjective:    Patient ID: [name], male    DOB: 1940-08-12, 74 y.o.   MRN: [id number]  HPI: [name] is a 74 y.o. male  Chief Complaint  Patient presents with  . Hyperlipidemia  . Coronary Artery Disease   Hyperlipidemia This is a chronic problem. This is a new diagnosis. The problem is controlled. Recent lipid tests were reviewed and are normal. There are no known factors aggravating his hyperlipidemia. Pertinent negatives include no chest pain, focal sensory loss, focal weakness, leg pain, myalgias or shortness of breath. The current treatment provides significant improvement of lipids. There are no compliance problems.   Coronary Artery Disease Pertinent negatives include no chest pain, palpitations or shortness of breath. Risk factors include hyperlipidemia and hypertension.  Hypertension This is a chronic problem. The problem is unchanged. The problem is controlled. Associated symptoms include neck pain and sweats. Pertinent negatives include no anxiety, blurred vision, chest pain, headaches, malaise/fatigue, orthopnea, palpitations, peripheral edema, PND or shortness of breath. There are no associated agents to hypertension. The current treatment provides significant improvement. There are no compliance problems.  Hypertensive end-organ damage includes kidney disease and CAD/MI. There is no history of angina, CVA, heart failure, left ventricular hypertrophy, PVD or retinopathy.    Relevant past medical, surgical, family and social history reviewed and updated as indicated. Interim medical history since our last visit reviewed. Allergies and medications reviewed and updated.  Review of Systems  Constitutional: Negative for malaise/fatigue.  Eyes: Negative for blurred vision.  Respiratory: Negative for shortness of  breath.   Cardiovascular: Negative for chest pain, palpitations, orthopnea and PND.  Musculoskeletal: Positive for neck pain. Negative for myalgias.  Neurological: Negative for focal weakness and headaches.    Per HPI unless specifically indicated above     Objective:    BP 116/60 mmHg  Pulse 51  Temp(Src) 97.6 F (36.4 C)  Ht 5' 7.2" (1.707 m)  Wt 235 lb 12.8 oz (106.958 kg)  BMI 36.71 kg/m2  SpO2 96%  Wt Readings from Last 3 Encounters:  08/16/14 235 lb 12.8 oz (106.958 kg)  03/27/14 234 lb (106.142 kg)  06/03/14 237 lb (107.502 kg)    Physical Exam  Constitutional: He is oriented to person, place, and time. He appears well-developed and well-nourished. No distress.  HENT:  Head: Normocephalic and atraumatic.  Eyes: Conjunctivae and lids are normal. Right eye exhibits no discharge. Left eye exhibits no discharge. No scleral icterus.  Neck: Normal range of motion.  Cardiovascular: Normal rate and regular rhythm.   Pulmonary/Chest: Effort normal and breath sounds normal. No respiratory distress.  Abdominal: Normal appearance and bowel sounds are normal. He exhibits no distension. There is no splenomegaly or hepatomegaly. There is no tenderness.  Musculoskeletal: Normal range of motion.  Neurological: He is alert and oriented to person, place, and time.  Skin: Skin is intact. No rash noted. No pallor.  Psychiatric: He has a normal mood and affect. His behavior is normal. Judgment and thought content normal.      Assessment & Plan:   Problem List Items Addressed This Visit      Cardiovascular and Mediastinum   Hypertensive heart and renal disease   Relevant Orders   Comprehensive metabolic panel   Coronary atherosclerosis of native coronary  artery   Relevant Orders   Lipid Panel Piccolo, Waived     Genitourinary   Chronic kidney disease, stage III (moderate) - Primary (Chronic)   Relevant Orders   Comprehensive metabolic panel    Other Visit Diagnoses     Hypercholesterolemia        Relevant Orders    Lipid Panel Piccolo, Waived       Diagnosis stable.  Continue present medications.  Refills will be called in by pharmacy.    Follow up plan: Return in about 6 months (around 02/15/2015) for for physical.

## 2014-08-17 ENCOUNTER — Other Ambulatory Visit: Payer: Self-pay | Admitting: Unknown Physician Specialty

## 2014-08-17 LAB — COMPREHENSIVE METABOLIC PANEL
ALT: 14 IU/L (ref 0–44)
AST: 17 IU/L (ref 0–40)
Albumin/Globulin Ratio: 2.1 (ref 1.1–2.5)
Albumin: 4.4 g/dL (ref 3.5–4.8)
Alkaline Phosphatase: 57 IU/L (ref 39–117)
BUN / CREAT RATIO: 19 (ref 10–22)
BUN: 23 mg/dL (ref 8–27)
Bilirubin Total: 0.5 mg/dL (ref 0.0–1.2)
CHLORIDE: 102 mmol/L (ref 97–108)
CO2: 23 mmol/L (ref 18–29)
Calcium: 9.1 mg/dL (ref 8.6–10.2)
Creatinine, Ser: 1.23 mg/dL (ref 0.76–1.27)
GFR calc non Af Amer: 58 mL/min/{1.73_m2} — ABNORMAL LOW (ref >59)
GFR, EST AFRICAN AMERICAN: 67 mL/min/{1.73_m2} (ref >59)
GLUCOSE: 120 mg/dL — AB (ref 65–99)
Globulin, Total: 2.1 g/dL (ref 1.5–4.5)
Potassium: 4.5 mmol/L (ref 3.5–5.2)
SODIUM: 141 mmol/L (ref 134–144)
TOTAL PROTEIN: 6.5 g/dL (ref 6.0–8.5)

## 2014-08-31 ENCOUNTER — Other Ambulatory Visit: Payer: Self-pay | Admitting: Unknown Physician Specialty

## 2014-12-02 ENCOUNTER — Ambulatory Visit (INDEPENDENT_AMBULATORY_CARE_PROVIDER_SITE_OTHER): Payer: Medicare Other | Admitting: Unknown Physician Specialty

## 2014-12-02 ENCOUNTER — Encounter: Payer: Self-pay | Admitting: Unknown Physician Specialty

## 2014-12-02 VITALS — BP 172/74 | HR 42 | Temp 98.3°F | Ht 68.0 in | Wt 238.6 lb

## 2014-12-02 DIAGNOSIS — R05 Cough: Secondary | ICD-10-CM | POA: Diagnosis not present

## 2014-12-02 DIAGNOSIS — J069 Acute upper respiratory infection, unspecified: Secondary | ICD-10-CM | POA: Diagnosis not present

## 2014-12-02 DIAGNOSIS — R059 Cough, unspecified: Secondary | ICD-10-CM

## 2014-12-02 MED ORDER — HYDROCOD POLST-CPM POLST ER 10-8 MG/5ML PO SUER: 5.0000 mL | Freq: Two times a day (BID) | ORAL | Status: DC | PRN

## 2014-12-02 NOTE — Progress Notes (Signed)
   BP 172/74 mmHg  Pulse 42  Temp(Src) 98.3 F (36.8 C)  Ht  (1.727 m)  Wt 238 lb 9.6 oz (108.228 kg)  BMI 36.29 kg/m2  SpO2 96%   Subjective:    Patient ID: [name], male    DOB: 10/30/1940, 74 y.o.   MRN: [id number]  HPI: [name] is a 74 y.o. male  Chief Complaint  Patient presents with  . Cough    pt states he has had a terrible cough for 5 or 6 days now, nothing comes up. Pt states he gets to coughing so bad and can't catch his breath.  . Insomnia    pt states he has only slept for about 5 hours in the last 5 days   Cough This is a new problem. The current episode started in the past 7 days. The problem has been unchanged. The problem occurs constantly. The cough is non-productive. Associated symptoms include postnasal drip. Pertinent negatives include no chest pain, chills, ear pain, fever or sweats. Nothing aggravates the symptoms. Risk factors: former. He has tried nothing for the symptoms.     Relevant past medical, surgical, family and social history reviewed and updated as indicated. Interim medical history since our last visit reviewed. Allergies and medications reviewed and updated.  Review of Systems  Constitutional: Negative for fever and chills.  HENT: Positive for postnasal drip. Negative for ear pain.   Respiratory: Positive for cough.   Cardiovascular: Negative for chest pain.    Per HPI unless specifically indicated above     Objective:    BP 172/74 mmHg  Pulse 42  Temp(Src) 98.3 F (36.8 C)  Ht  (1.727 m)  Wt 238 lb 9.6 oz (108.228 kg)  BMI 36.29 kg/m2  SpO2 96%  Wt Readings from Last 3 Encounters:  12/02/14 238 lb 9.6 oz (108.228 kg)  08/16/14 235 lb 12.8 oz (106.958 kg)  03/27/14 234 lb (106.142 kg)    Physical Exam  Constitutional: He is oriented to person, place, and time. He appears well-developed and well-nourished. No distress.  HENT:  Head: Normocephalic and atraumatic.  Eyes: Conjunctivae and lids  are normal. Right eye exhibits no discharge. Left eye exhibits no discharge. No scleral icterus.  Cardiovascular: Normal rate, regular rhythm and normal heart sounds.   Pulmonary/Chest: Effort normal. No respiratory distress. He has no wheezes. He has no rales.  Abdominal: Normal appearance. There is no splenomegaly or hepatomegaly.  Musculoskeletal: Normal range of motion.  Neurological: He is alert and oriented to person, place, and time.  Skin: Skin is warm, dry and intact. No rash noted. No pallor.  Psychiatric: He has a normal mood and affect. His behavior is normal. Judgment and thought content normal.     Assessment & Plan:   Problem List Items Addressed This Visit    None    Visit Diagnoses    URI (upper respiratory infection)    -  Primary    supportive care    Cough            Follow up plan: Return if symptoms worsen or fail to improve.

## 2015-01-10 ENCOUNTER — Ambulatory Visit (INDEPENDENT_AMBULATORY_CARE_PROVIDER_SITE_OTHER): Payer: Medicare Other | Admitting: Unknown Physician Specialty

## 2015-01-10 ENCOUNTER — Encounter: Payer: Self-pay | Admitting: Unknown Physician Specialty

## 2015-01-10 VITALS — BP 132/66 | HR 48 | Temp 98.3°F | Ht 68.2 in | Wt 239.4 lb

## 2015-01-10 DIAGNOSIS — J069 Acute upper respiratory infection, unspecified: Secondary | ICD-10-CM | POA: Diagnosis not present

## 2015-01-10 MED ORDER — HYDROCOD POLST-CPM POLST ER 10-8 MG/5ML PO SUER: 5.0000 mL | Freq: Two times a day (BID) | ORAL | Status: DC | PRN

## 2015-01-10 NOTE — Patient Instructions (Signed)
Viral Infections °A viral infection can be caused by different types of viruses. Most viral infections are not serious and resolve on their own. However, some infections may cause severe symptoms and may lead to further complications. °SYMPTOMS °Viruses can frequently cause: °· Minor sore throat. °· Aches and pains. °· Headaches. °· Runny nose. °· Different types of rashes. °· Watery eyes. °· Tiredness. °· Cough. °· Loss of appetite. °· Gastrointestinal infections, resulting in nausea, vomiting, and diarrhea. °These symptoms do not respond to antibiotics because the infection is not caused by bacteria. However, you might catch a bacterial infection following the viral infection. This is sometimes called a "superinfection." Symptoms of such a bacterial infection may include: °· Worsening sore throat with pus and difficulty swallowing. °· Swollen neck glands. °· Chills and a high or persistent fever. °· Severe headache. °· Tenderness over the sinuses. °· Persistent overall ill feeling (malaise), muscle aches, and tiredness (fatigue). °· Persistent cough. °· Yellow, green, or brown mucus production with coughing. °HOME CARE INSTRUCTIONS  °· Only take over-the-counter or prescription medicines for pain, discomfort, diarrhea, or fever as directed by your caregiver. °· Drink enough water and fluids to keep your urine clear or pale yellow. Sports drinks can provide valuable electrolytes, sugars, and hydration. °· Get plenty of rest and maintain proper nutrition. Soups and broths with crackers or rice are fine. °SEEK IMMEDIATE MEDICAL CARE IF:  °· You have severe headaches, shortness of breath, chest pain, neck pain, or an unusual rash. °· You have uncontrolled vomiting, diarrhea, or you are unable to keep down fluids. °· You or your child has an oral temperature above 102° F (38.9° C), not controlled by medicine. °· Your baby is older than 3 months with a rectal temperature of 102° F (38.9° C) or higher. °· Your baby is 3  months old or younger with a rectal temperature of 100.4° F (38° C) or higher. °MAKE SURE YOU:  °· Understand these instructions. °· Will watch your condition. °· Will get help right away if you are not doing well or get worse. °  °This information is not intended to replace advice given to you by your health care provider. Make sure you discuss any questions you have with your health care provider. °  °Document Released: 11/18/2004 Document Revised: 05/03/2011 Document Reviewed: 07/17/2014 °Elsevier Interactive Patient Education ©2016 [employer] ° °

## 2015-01-10 NOTE — Progress Notes (Signed)
   BP 132/66 mmHg  Pulse 48  Temp(Src) 98.3 F (36.8 C)  Ht 5' 8.2" (1.732 m)  Wt 239 lb 6.4 oz (108.591 kg)  BMI 36.20 kg/m2  SpO2 97%   Subjective:    Patient ID: [name], male    DOB: 01/11/1941, 74 y.o.   MRN: [id number]  HPI: [name] is a 74 y.o. male  Chief Complaint  Patient presents with  . URI    pt states he has a cough, nasal congestion, loosing voice, and some chest congestion. States symptoms started Wednesday.   URI  This is a new problem. The current episode started in the past 7 days. The problem has been gradually worsening. There has been no fever. Associated symptoms include congestion, coughing, rhinorrhea and a sore throat. Pertinent negatives include no ear pain. He has tried nothing for the symptoms.    Relevant past medical, surgical, family and social history reviewed and updated as indicated. Interim medical history since our last visit reviewed. Allergies and medications reviewed and updated.  Review of Systems  HENT: Positive for congestion, rhinorrhea and sore throat. Negative for ear pain.   Respiratory: Positive for cough.     Per HPI unless specifically indicated above     Objective:    BP 132/66 mmHg  Pulse 48  Temp(Src) 98.3 F (36.8 C)  Ht 5' 8.2" (1.732 m)  Wt 239 lb 6.4 oz (108.591 kg)  BMI 36.20 kg/m2  SpO2 97%  Wt Readings from Last 3 Encounters:  01/10/15 239 lb 6.4 oz (108.591 kg)  12/02/14 238 lb 9.6 oz (108.228 kg)  08/16/14 235 lb 12.8 oz (106.958 kg)    Physical Exam  Constitutional: He is oriented to person, place, and time. He appears well-developed and well-nourished. No distress.  HENT:  Head: Normocephalic and atraumatic.  Right Ear: Tympanic membrane and ear canal normal.  Left Ear: Tympanic membrane and ear canal normal.  Nose: Rhinorrhea present. Right sinus exhibits no maxillary sinus tenderness and no frontal sinus tenderness. Left sinus exhibits no maxillary sinus tenderness and no  frontal sinus tenderness.  Mouth/Throat: Uvula is midline. Posterior oropharyngeal edema present.  Eyes: Conjunctivae and lids are normal. Right eye exhibits no discharge. Left eye exhibits no discharge. No scleral icterus.  Neck: Neck supple.  Cardiovascular: Normal rate, regular rhythm and normal heart sounds.   Pulmonary/Chest: Effort normal and breath sounds normal. No respiratory distress.  Abdominal: Normal appearance. There is no splenomegaly or hepatomegaly.  Musculoskeletal: Normal range of motion.  Neurological: He is alert and oriented to person, place, and time.  Skin: Skin is warm, dry and intact. No rash noted. No pallor.  Psychiatric: He has a normal mood and affect. His behavior is normal. Judgment and thought content normal.  Nursing note and vitals reviewed.      Assessment & Plan:   Problem List Items Addressed This Visit    None    Visit Diagnoses    URI (upper respiratory infection)    -  Primary    Supportive care.  Rx for tussinex        Follow up plan: Return if symptoms worsen or fail to improve.

## 2015-01-14 ENCOUNTER — Other Ambulatory Visit: Payer: Self-pay | Admitting: Unknown Physician Specialty

## 2015-02-21 ENCOUNTER — Encounter: Payer: Medicare Other | Admitting: Unknown Physician Specialty

## 2015-02-26 ENCOUNTER — Other Ambulatory Visit: Payer: Self-pay | Admitting: Family Medicine

## 2015-02-26 NOTE — Telephone Encounter (Signed)
Sending to primary 

## 2015-03-07 ENCOUNTER — Ambulatory Visit (INDEPENDENT_AMBULATORY_CARE_PROVIDER_SITE_OTHER): Payer: Medicare Other | Admitting: Urology

## 2015-03-07 ENCOUNTER — Encounter: Payer: Self-pay | Admitting: Urology

## 2015-03-07 VITALS — BP 155/75 | HR 51 | Ht 68.0 in | Wt 238.7 lb

## 2015-03-07 DIAGNOSIS — N400 Enlarged prostate without lower urinary tract symptoms: Secondary | ICD-10-CM

## 2015-03-07 DIAGNOSIS — N359 Urethral stricture, unspecified: Secondary | ICD-10-CM

## 2015-03-07 LAB — BLADDER SCAN AMB NON-IMAGING

## 2015-03-07 NOTE — Progress Notes (Signed)
Uroflow  Peak Flow: 11ml Average Flow: 9.663ml Voided Volume: 184ml Voiding Time: 19.9sec Flow Time: 19.7sec Time to Peak Flow: 3.8sec  PVR Volume: 12ml

## 2015-03-07 NOTE — Progress Notes (Signed)
03/07/2015 11:57 AM   [name] 1940-03-20 [id number]  Referring provider: Gabriel Cirri, NP [address], [place] [address]  Chief Complaint  Patient presents with  . Urethral Stricture    1year    HPI: 75 yo M with a history of microscopic hematuria status post negative workup 12/2013, phimosis status post circumcision, history of urethral strictures status post DVIU and BPH who presents today for routine follow-up.  Since his DVIU 1 year ago, he is noted markedly improvement in his urinary stream. He reports that he now has very good flow and is able to completely empty his bladder. He has very few voiding complaints today. He has not noticed decreasing caliber of his urinary stream over the past year.  In terms of BPH, he has been on finasteride for approximate 2-1/2 years.  Today, he has no urinary complaints.  He also has a history of recurrent urinary tract infections but had none since his circumcision.  IPSS today 0/1, pleased.     PMH: Past Medical History  Diagnosis Date  . Carotid artery occlusion   . Renal artery stenosis (HCC)   . Hypertension   . Coronary artery disease   . Atrial fibrillation (HCC)   . CHF (congestive heart failure) (HCC)   . Kidney failure   . Hyperlipemia   . CKD (chronic kidney disease)   . Heart murmur   . UTI (lower urinary tract infection)   . Anterior urethral stricture   . Hematuria, microscopic   . Phimosis/adherent prepuce   . Aortic ectasia (HCC)   . BPH (benign prostatic hyperplasia)     Surgical History: Past Surgical History  Procedure Laterality Date  . Carotid endarterectomy  04/29/2006    right  . Carotid endarterectomy  12/09/2006    left  . Coronary artery bypass graft  2008  . Cholecystectomy  2012    Gall Bladder  . Colonoscopy  05/05/2010    Home Medications:    Medication List       This list is accurate as of: 03/07/15 11:59 PM.  Always use your most recent med list.               amLODipine 5 MG tablet  Commonly known as:  NORVASC  Take 5 mg by mouth daily.     aspirin 81 MG tablet  Take 81 mg by mouth daily.     benzonatate 100 MG capsule  Commonly known as:  TESSALON     carvedilol 25 MG tablet  Commonly known as:  COREG  Take by mouth. Take 1/2 tablet every morning and 1 tablet every night     clopidogrel 75 MG tablet  Commonly known as:  PLAVIX  Take 1 tablet (75 mg total) by mouth daily.     ENTRESTO 24-26 MG  Generic drug:  sacubitril-valsartan  Take 1 tablet by mouth 2 (two) times daily.     finasteride 5 MG tablet  Commonly known as:  PROSCAR  daily.     rosuvastatin 40 MG tablet  Commonly known as:  CRESTOR  1 Tablet once each day ORAL        Allergies: No Known Allergies  Family History: Family History  Problem Relation Age of Onset  . Heart disease Mother     Before age 3  . Heart attack Mother   . Cancer Mother     Breast  . Hyperlipidemia Mother   . Hypertension Mother   . Diabetes Mother   .  Heart disease Father     Heart Disease before age 72  . Heart attack Father   . Hyperlipidemia Father   . Hypertension Father   . Cancer Sister     Breast  . Heart disease Sister     Before age 59  . Hyperlipidemia Sister   . Hypertension Sister   . Heart attack Sister   . Diabetes Sister   . Cancer Daughter     Colon    Social History:  reports that he quit smoking about 23 years ago. His smoking use included Cigarettes. He has never used smokeless tobacco. He reports that he does not drink alcohol or use illicit drugs.  ROS: UROLOGY Frequent Urination?: No Hard to postpone urination?: No Burning/pain with urination?: No Get up at night to urinate?: No Leakage of urine?: No Urine stream starts and stops?: No Trouble starting stream?: No Do you have to strain to urinate?: No Blood in urine?: No Urinary tract infection?: No Sexually transmitted disease?: No Injury to kidneys or bladder?: No Painful intercourse?:  No Weak stream?: No Erection problems?: No Penile pain?: No  Gastrointestinal Nausea?: No Vomiting?: No Indigestion/heartburn?: No Diarrhea?: No Constipation?: No  Constitutional Fever: No Night sweats?: No Weight loss?: No Fatigue?: No  Skin Skin rash/lesions?: No Itching?: No  Eyes Blurred vision?: No Double vision?: No  Ears/Nose/Throat Sore throat?: No Sinus problems?: No  Hematologic/Lymphatic Swollen glands?: No Easy bruising?: No  Cardiovascular Leg swelling?: No Chest pain?: No  Respiratory Cough?: No Shortness of breath?: No  Endocrine Excessive thirst?: No  Musculoskeletal Back pain?: No Joint pain?: No  Neurological Headaches?: No Dizziness?: No  Psychologic Depression?: No Anxiety?: No  Physical Exam: BP 155/75 mmHg  Pulse 51  Ht 5\' 8"  (1.727 m)  Wt 238 lb 11.2 oz (108.274 kg)  BMI 36.30 kg/m2  Constitutional:  Alert and oriented, No acute distress. HEENT: [place] AT, moist mucus membranes.  Trachea midline, no masses. Cardiovascular: No clubbing, cyanosis, or edema. Respiratory: Normal respiratory effort, no increased work of breathing. GI: Abdomen is soft, nontender, nondistended, no abdominal masses GU: No CVA tenderness.  Circumcised phallus with well-healed surgical incision scar. Patent urethral meatus.  Large glans. Rectal: Normal sphincter tone, mildly enlarged prostate, 40 cc without tenderness or nodules. Skin: No rashes, bruises or suspicious lesions. Neurologic: Grossly intact, no focal deficits, moving all 4 extremities. Psychiatric: Normal mood and affect.  Laboratory Data: Lab Results  Component Value Date   WBC 3.7* 06/21/2012   HGB 11.9* 06/21/2012   HCT 33.1* 06/21/2012   MCV 85 06/21/2012   PLT 133* 06/21/2012    Lab Results  Component Value Date   CREATININE 1.23 08/16/2014    09/07/2013 PSA 3.9.  Lab Results  Component Value Date   HGBA1C  12/07/2006    5.5 (NOTE)   The ADA recommends the  following therapeutic goals for glycemic   control related to Hgb A1C measurement:   Goal of Therapy:   < 7.0% Hgb A1C   Action Suggested:  > 8.0% Hgb A1C   Ref:  Diabetes Care, 22, Suppl. 1, 1999    Urinalysis Results for orders placed or performed in visit on 03/07/15  PSA  Result Value Ref Range   Prostate Specific Ag, Serum 0.4 0.0 - 4.0 ng/mL  BLADDER SCAN AMB NON-IMAGING  Result Value Ref Range   Scan Result 12ml     Pertinent Imaging: Uroflow Obstructive appearing voiding curve   Peak Flow: 11ml Average Flow: 9.43ml Voided  Volume: 184ml Voiding Time: 19.9sec Flow Time: 19.7sec Time to Peak Flow: 3.8sec  PVR Volume: 12ml  Assessment & Plan:    1. Anterior urethral stricture, unspecified stricture type ~8 Fr soft pendulous urethral stricture and bulbar urethral stricture s/p DVIU, RUG, and circumcision 11/15 Uroflow with relatively low flow with somewhat obstructive patter BUT asymptomatic and emptying well - PR COMPLEX UROFLOWMETRY - BLADDER SCAN AMB NON-IMAGING  2. BPH Stable on finasteride May consider stopping in the future if continues to do well, symptoms improved after treating stricture, difficulty to assess whether improvement in urinary symptoms related to tx stricture or rx finasteride PSA/ DRE today wnl, continue annual PSA while on finasteride  Return in about 1 year (around 03/06/2016) for IPSS, PSA/ DRE, uroflow.  Vanna ScotlandAshley Rayshawn Visconti, MD  [facility] [address], [address] [place], [place] [id number]

## 2015-03-08 ENCOUNTER — Encounter: Payer: Self-pay | Admitting: Urology

## 2015-03-08 LAB — PSA: Prostate Specific Ag, Serum: 0.4 ng/mL (ref 0.0–4.0)

## 2015-04-14 ENCOUNTER — Other Ambulatory Visit: Payer: Self-pay | Admitting: Unknown Physician Specialty

## 2015-04-14 ENCOUNTER — Other Ambulatory Visit: Payer: Self-pay

## 2015-04-14 DIAGNOSIS — N400 Enlarged prostate without lower urinary tract symptoms: Secondary | ICD-10-CM

## 2015-04-14 MED ORDER — FINASTERIDE 5 MG PO TABS: 5.0000 mg | ORAL_TABLET | Freq: Every day | ORAL | Status: DC

## 2015-05-26 ENCOUNTER — Other Ambulatory Visit: Payer: Self-pay | Admitting: Unknown Physician Specialty

## 2015-05-26 NOTE — Telephone Encounter (Signed)
Your patient 

## 2015-05-30 ENCOUNTER — Encounter: Payer: Self-pay | Admitting: Family

## 2015-06-09 ENCOUNTER — Ambulatory Visit (HOSPITAL_COMMUNITY)
Admission: RE | Admit: 2015-06-09 | Discharge: 2015-06-09 | Disposition: A | Discharge status: Home / Self Care | Payer: Medicare Other | Source: Ambulatory Visit | Attending: Family | Admitting: Family

## 2015-06-09 ENCOUNTER — Encounter: Payer: Self-pay | Admitting: Family

## 2015-06-09 ENCOUNTER — Ambulatory Visit (INDEPENDENT_AMBULATORY_CARE_PROVIDER_SITE_OTHER): Payer: Medicare Other | Admitting: Family

## 2015-06-09 VITALS — BP 132/72 | HR 46 | Ht 68.0 in | Wt 245.0 lb

## 2015-06-09 DIAGNOSIS — I509 Heart failure, unspecified: Secondary | ICD-10-CM | POA: Insufficient documentation

## 2015-06-09 DIAGNOSIS — I2510 Atherosclerotic heart disease of native coronary artery without angina pectoris: Secondary | ICD-10-CM | POA: Diagnosis not present

## 2015-06-09 DIAGNOSIS — Z9889 Other specified postprocedural states: Secondary | ICD-10-CM

## 2015-06-09 DIAGNOSIS — I6523 Occlusion and stenosis of bilateral carotid arteries: Secondary | ICD-10-CM

## 2015-06-09 DIAGNOSIS — N189 Chronic kidney disease, unspecified: Secondary | ICD-10-CM | POA: Insufficient documentation

## 2015-06-09 DIAGNOSIS — I130 Hypertensive heart and chronic kidney disease with heart failure and stage 1 through stage 4 chronic kidney disease, or unspecified chronic kidney disease: Secondary | ICD-10-CM | POA: Diagnosis not present

## 2015-06-09 DIAGNOSIS — E785 Hyperlipidemia, unspecified: Secondary | ICD-10-CM | POA: Diagnosis not present

## 2015-06-09 DIAGNOSIS — Z48812 Encounter for surgical aftercare following surgery on the circulatory system: Secondary | ICD-10-CM

## 2015-06-09 DIAGNOSIS — Z87891 Personal history of nicotine dependence: Secondary | ICD-10-CM | POA: Diagnosis not present

## 2015-06-09 NOTE — Progress Notes (Signed)
Chief Complaint: Extracranial Carotid Artery Stenosis   History of Present Illness  [name] is a 75 y.o. male  patient of Dr. Myra Gianotti who is status post right CEA in March 2008, left CEA in October 2008. He returns today for follow up. He gets 4 hours walking into his daily routine on his job, 5 days/week, also plays golf twice/week. He denies claudication symptoms in his legs with walking. 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  Patient denies any history of TIA or stroke symptoms.Specifically he denies a history of amaurosis fugax or monocular blindness, unilateral facial drooping, hemiparesis, or receptive or expressive aphasia.  He denies tingling, numbness, weakness, or pain in either hand/arm, denies dizziness.  Pt Diabetic: No Pt smoker: former smoker, quit in 1994  Pt meds include: Statin : Yes ASA: Yes Other anticoagulants/antiplatelets: Plavix started after the CABG   Past Medical History  Diagnosis Date  . Carotid artery occlusion   . Renal artery stenosis (HCC)   . Hypertension   . Coronary artery disease   . Atrial fibrillation (HCC)   . CHF (congestive heart failure) (HCC)   . Kidney failure   . Hyperlipemia   . CKD (chronic kidney disease)   . Heart murmur   . UTI (lower urinary tract infection)   . Anterior urethral stricture   . Hematuria, microscopic   . Phimosis/adherent prepuce   . Aortic ectasia (HCC)   . BPH (benign prostatic hyperplasia)     Social History Social History  Substance Use Topics  . Smoking status: Former Smoker    Types: Cigarettes    Quit date: 02/23/1992  . Smokeless tobacco: Never Used  . Alcohol Use: No    Family History Family History  Problem Relation Age of Onset  . Heart disease Mother     Before age 80  . Heart attack Mother   . Cancer Mother     Breast  . Hyperlipidemia Mother   . Hypertension Mother   . Diabetes Mother   . Heart disease Father     Heart Disease  before age 31  . Heart attack Father   . Hyperlipidemia Father   . Hypertension Father   . Cancer Sister     Breast  . Heart disease Sister     Before age 59  . Hyperlipidemia Sister   . Hypertension Sister   . Heart attack Sister   . Diabetes Sister   . Cancer Daughter     Colon    Surgical History Past Surgical History  Procedure Laterality Date  . Carotid endarterectomy  04/29/2006    right  . Carotid endarterectomy  12/09/2006    left  . Coronary artery bypass graft  2008  . Cholecystectomy  2012    Gall Bladder  . Colonoscopy  05/05/2010    No Known Allergies  Current Outpatient Prescriptions  Medication Sig Dispense Refill  . amLODipine (NORVASC) 5 MG tablet Take 5 mg by mouth daily.    Marland Kitchen aspirin 81 MG tablet Take 81 mg by mouth daily.    . benzonatate (TESSALON) 100 MG capsule     . carvedilol (COREG) 25 MG tablet Take by mouth. Take 1/2 tablet every morning and 1 tablet every night    . clopidogrel (PLAVIX) 75 MG tablet Take 1 tablet (75 mg total) by mouth daily. 90 tablet 0  . finasteride (PROSCAR) 5 MG tablet Take 1 tablet (5 mg total) by  mouth daily. 30 tablet 3  . rosuvastatin (CRESTOR) 40 MG tablet 1 Tablet once each day ORAL 90 tablet 0  . sacubitril-valsartan (ENTRESTO) 24-26 MG Take 1 tablet by mouth 2 (two) times daily.     No current facility-administered medications for this visit.    Review of Systems : See HPI for pertinent positives and negatives.  Physical Examination  Filed Vitals:   06/09/15 0921 06/09/15 0923  BP: 136/75 132/72  Pulse: 46   Height:  (1.727 m)   Weight: 245 lb (111.131 kg)   SpO2: 96%    Body mass index is 37.26 kg/(m^2).  General: WDWN obese male in NAD GAIT: normal Eyes: PERRLA Pulmonary: Non-labored respirations, CTAB with adequate air movement.  Cardiac: regular rhythm, no detected murmur. Palpable pedal pulses. Trace pitting edema in left ankle.  VASCULAR EXAM Carotid Bruits Left Right    Positive positive    Radial pulses are 2+ palpable and equal.     Gastrointestinal: soft, nontender, BS WNL, no r/g, no palpable masses.  Musculoskeletal: No muscle atrophy/wasting. M/S 5/5 throughout, Extremities without ischemic changes.  Neurologic: A&O X 3; Appropriate Affect, Speech is normal CN 2-12 intact, Pain and light touch intact in extremities, Motor exam as listed above                Non-Invasive Vascular Imaging CAROTID DUPLEX 06/09/2015   CEREBROVASCULAR DUPLEX EVALUATION    INDICATION: Carotid artery disease    PREVIOUS INTERVENTION(S): Bilateral carotid endarterectomy 04/29/2006 and 12/09/2006    DUPLEX EXAM: Carotid duplex    RIGHT  LEFT  Peak Systolic Velocities (cm/s) End Diastolic Velocities (cm/s) Plaque LOCATION Peak Systolic Velocities (cm/s) End Diastolic Velocities (cm/s) Plaque  110 18 HT CCA PROXIMAL 113 20 HT  89 15 HT CCA MID 110 20 HT  138 21 HT CCA DISTAL 115 19   158 17  ECA 112 6   146 15  ICA PROXIMAL 75 7   64 15  ICA MID 50 13   77 18  ICA DISTAL 78 21     N/A ICA / CCA Ratio (PSV) N/A  Antegrade Vertebral Flow Antegrade  - Brachial Systolic Pressure (mmHg) -  Triphasic Brachial Artery Waveforms Triphasic    Plaque Morphology:  HM = Homogeneous, HT = Heterogeneous, CP = Calcific Plaque, SP = Smooth Plaque, IP = Irregular Plaque     ADDITIONAL FINDINGS: Right subclavian artery 178 cm/s biphasic waveform, noted to be turbulent, Left subclavian artery 184 cm/s triphasic waveform    IMPRESSION: 1. Patent bilateral carotid endarterectomy sites with no evidence for restenosis.    Compared to the previous exam:  No significant change since prior exam       Assessment: [name] is a 75 y.o. male who is status post right CEA in March 2008, left CEA in October 2008. He has no history of stroke  or TIA. Today's carotid Duplex suggests bilateral carotid endarterectomy sites with no evidence for restenosis. No significant change since prior exam.   Plan: Follow-up in 1 year with Carotid Duplex scan.   I discussed in depth with the patient the nature of atherosclerosis, and emphasized the importance of maximal medical management including strict control of blood pressure, blood glucose, and lipid levels, obtaining regular exercise, and continued cessation of smoking.  The patient is aware that without maximal medical management the underlying atherosclerotic disease process will progress, limiting the benefit of any interventions. The patient was given information about stroke prevention and  what symptoms should prompt the patient to seek immediate medical care. Thank you for allowing us to participate in this patient's care.  Charisse MarchSuzanne Divon Krabill, RN, MSN, FNP-C [facility] Office: [phone]  Clinic Physician: Myra GianottiBrabham  06/09/2015 9:36 AM

## 2015-06-09 NOTE — Patient Instructions (Signed)
Stroke Prevention Some medical conditions and behaviors are associated with an increased chance of having a stroke. You may prevent a stroke by making healthy choices and managing medical conditions. HOW CAN I REDUCE MY RISK OF HAVING A STROKE?   Stay physically active. Get at least 30 minutes of activity on most or all days.  Do not smoke. It may also be helpful to avoid exposure to secondhand smoke.  Limit alcohol use. Moderate alcohol use is considered to be:  No more than 2 drinks per day for men.  No more than 1 drink per day for nonpregnant women.  Eat healthy foods. This involves:  Eating 5 or more servings of fruits and vegetables a day.  Making dietary changes that address high blood pressure (hypertension), high cholesterol, diabetes, or obesity.  Manage your cholesterol levels.  Making food choices that are high in fiber and low in saturated fat, trans fat, and cholesterol may control cholesterol levels.  Take any prescribed medicines to control cholesterol as directed by your health care provider.  Manage your diabetes.  Controlling your carbohydrate and sugar intake is recommended to manage diabetes.  Take any prescribed medicines to control diabetes as directed by your health care provider.  Control your hypertension.  Making food choices that are low in salt (sodium), saturated fat, trans fat, and cholesterol is recommended to manage hypertension.  Ask your health care provider if you need treatment to lower your blood pressure. Take any prescribed medicines to control hypertension as directed by your health care provider.  If you are 18-39 years of age, have your blood pressure checked every 3-5 years. If you are 40 years of age or older, have your blood pressure checked every year.  Maintain a healthy weight.  Reducing calorie intake and making food choices that are low in sodium, saturated fat, trans fat, and cholesterol are recommended to manage  weight.  Stop drug abuse.  Avoid taking birth control pills.  Talk to your health care provider about the risks of taking birth control pills if you are over 35 years old, smoke, get migraines, or have ever had a blood clot.  Get evaluated for sleep disorders (sleep apnea).  Talk to your health care provider about getting a sleep evaluation if you snore a lot or have excessive sleepiness.  Take medicines only as directed by your health care provider.  For some people, aspirin or blood thinners (anticoagulants) are helpful in reducing the risk of forming abnormal blood clots that can lead to stroke. If you have the irregular heart rhythm of atrial fibrillation, you should be on a blood thinner unless there is a good reason you cannot take them.  Understand all your medicine instructions.  Make sure that other conditions (such as anemia or atherosclerosis) are addressed. SEEK IMMEDIATE MEDICAL CARE IF:   You have sudden weakness or numbness of the face, arm, or leg, especially on one side of the body.  Your face or eyelid droops to one side.  You have sudden confusion.  You have trouble speaking (aphasia) or understanding.  You have sudden trouble seeing in one or both eyes.  You have sudden trouble walking.  You have dizziness.  You have a loss of balance or coordination.  You have a sudden, severe headache with no known cause.  You have new chest pain or an irregular heartbeat. Any of these symptoms may represent a serious problem that is an emergency. Do not wait to see if the symptoms will   go away. Get medical help at once. Call your local emergency services (911 in [place] Do not drive yourself to the hospital.   This information is not intended to replace advice given to you by your health care provider. Make sure you discuss any questions you have with your health care provider.   Document Released: 03/18/2004 Document Revised: 03/01/2014 Document Reviewed:  08/11/2012 Elsevier Interactive Patient Education 2016 [employer] Inc[employer]  

## 2015-07-18 ENCOUNTER — Other Ambulatory Visit: Payer: Self-pay | Admitting: *Deleted

## 2015-07-18 DIAGNOSIS — I6523 Occlusion and stenosis of bilateral carotid arteries: Secondary | ICD-10-CM

## 2015-07-22 ENCOUNTER — Other Ambulatory Visit: Payer: Self-pay

## 2015-07-22 MED ORDER — ROSUVASTATIN CALCIUM 40 MG PO TABS: 40.0000 mg | ORAL_TABLET | Freq: Every day | ORAL | Status: DC

## 2015-07-22 NOTE — Telephone Encounter (Signed)
Patient was last seen for a f/u 07/2014. [facility]

## 2015-08-19 ENCOUNTER — Other Ambulatory Visit: Payer: Self-pay

## 2015-08-19 DIAGNOSIS — N400 Enlarged prostate without lower urinary tract symptoms: Secondary | ICD-10-CM

## 2015-08-19 MED ORDER — FINASTERIDE 5 MG PO TABS: 5.0000 mg | ORAL_TABLET | Freq: Every day | ORAL | Status: DC

## 2015-11-05 ENCOUNTER — Telehealth: Payer: Self-pay

## 2015-11-05 NOTE — Telephone Encounter (Signed)
appt 11/07/15 with Fleet Contrasachel.

## 2015-11-05 NOTE — Telephone Encounter (Signed)
Doing HQPAF's and notice that this patient has not been seen in 2017. Tried calling to get a physical scheduled but patient did not answer and no voicemail was set up. Will try to call again later.

## 2015-11-07 ENCOUNTER — Emergency Department
Admission: EM | Admit: 2015-11-07 | Discharge: 2015-11-08 | Disposition: A | Discharge status: Home / Self Care | Payer: Medicare Other | Attending: Emergency Medicine | Admitting: Emergency Medicine

## 2015-11-07 ENCOUNTER — Ambulatory Visit (INDEPENDENT_AMBULATORY_CARE_PROVIDER_SITE_OTHER): Payer: Medicare Other | Admitting: Family Medicine

## 2015-11-07 ENCOUNTER — Encounter: Payer: Self-pay | Admitting: Emergency Medicine

## 2015-11-07 ENCOUNTER — Encounter: Payer: Self-pay | Admitting: Family Medicine

## 2015-11-07 ENCOUNTER — Emergency Department: Payer: Medicare Other

## 2015-11-07 ENCOUNTER — Telehealth: Payer: Self-pay | Admitting: Family Medicine

## 2015-11-07 VITALS — BP 136/71 | HR 44 | Temp 98.4°F | Ht 68.0 in | Wt 237.8 lb

## 2015-11-07 DIAGNOSIS — Z7982 Long term (current) use of aspirin: Secondary | ICD-10-CM | POA: Insufficient documentation

## 2015-11-07 DIAGNOSIS — N183 Chronic kidney disease, stage 3 unspecified: Secondary | ICD-10-CM

## 2015-11-07 DIAGNOSIS — I2510 Atherosclerotic heart disease of native coronary artery without angina pectoris: Secondary | ICD-10-CM | POA: Diagnosis not present

## 2015-11-07 DIAGNOSIS — Z131 Encounter for screening for diabetes mellitus: Secondary | ICD-10-CM | POA: Diagnosis not present

## 2015-11-07 DIAGNOSIS — Z23 Encounter for immunization: Secondary | ICD-10-CM

## 2015-11-07 DIAGNOSIS — I130 Hypertensive heart and chronic kidney disease with heart failure and stage 1 through stage 4 chronic kidney disease, or unspecified chronic kidney disease: Secondary | ICD-10-CM | POA: Insufficient documentation

## 2015-11-07 DIAGNOSIS — E785 Hyperlipidemia, unspecified: Secondary | ICD-10-CM | POA: Diagnosis not present

## 2015-11-07 DIAGNOSIS — R509 Fever, unspecified: Secondary | ICD-10-CM | POA: Insufficient documentation

## 2015-11-07 DIAGNOSIS — I509 Heart failure, unspecified: Secondary | ICD-10-CM | POA: Diagnosis not present

## 2015-11-07 DIAGNOSIS — N400 Enlarged prostate without lower urinary tract symptoms: Secondary | ICD-10-CM | POA: Diagnosis not present

## 2015-11-07 DIAGNOSIS — Z0000 Encounter for general adult medical examination without abnormal findings: Secondary | ICD-10-CM

## 2015-11-07 DIAGNOSIS — Z7951 Long term (current) use of inhaled steroids: Secondary | ICD-10-CM | POA: Diagnosis not present

## 2015-11-07 DIAGNOSIS — Z79899 Other long term (current) drug therapy: Secondary | ICD-10-CM | POA: Insufficient documentation

## 2015-11-07 DIAGNOSIS — Z87891 Personal history of nicotine dependence: Secondary | ICD-10-CM | POA: Insufficient documentation

## 2015-11-07 DIAGNOSIS — R51 Headache: Secondary | ICD-10-CM | POA: Diagnosis not present

## 2015-11-07 DIAGNOSIS — Z951 Presence of aortocoronary bypass graft: Secondary | ICD-10-CM | POA: Diagnosis not present

## 2015-11-07 LAB — COMPREHENSIVE METABOLIC PANEL
ALBUMIN: 4.6 g/dL (ref 3.5–5.0)
ALK PHOS: 44 U/L (ref 38–126)
ALT: 15 U/L — AB (ref 17–63)
AST: 24 U/L (ref 15–41)
Anion gap: 7 (ref 5–15)
BUN: 23 mg/dL — AB (ref 6–20)
CALCIUM: 9.4 mg/dL (ref 8.9–10.3)
CO2: 26 mmol/L (ref 22–32)
CREATININE: 1.43 mg/dL — AB (ref 0.61–1.24)
Chloride: 104 mmol/L (ref 101–111)
GFR calc non Af Amer: 47 mL/min — ABNORMAL LOW (ref >60)
GFR, EST AFRICAN AMERICAN: 55 mL/min — AB (ref >60)
GLUCOSE: 127 mg/dL — AB (ref 65–99)
Potassium: 4.4 mmol/L (ref 3.5–5.1)
SODIUM: 137 mmol/L (ref 135–145)
Total Bilirubin: 0.7 mg/dL (ref 0.3–1.2)
Total Protein: 7.5 g/dL (ref 6.5–8.1)

## 2015-11-07 LAB — CBC WITH DIFFERENTIAL/PLATELET
Basophils Absolute: 0 10*3/uL (ref 0–0.1)
Basophils Relative: 0 %
EOS ABS: 0.1 10*3/uL (ref 0–0.7)
EOS PCT: 1 %
HCT: 47.9 % (ref 40.0–52.0)
Hemoglobin: 16.6 g/dL (ref 13.0–18.0)
LYMPHS ABS: 0.2 10*3/uL — AB (ref 1.0–3.6)
Lymphocytes Relative: 2 %
MCH: 30.4 pg (ref 26.0–34.0)
MCHC: 34.7 g/dL (ref 32.0–36.0)
MCV: 87.7 fL (ref 80.0–100.0)
MONO ABS: 0.5 10*3/uL (ref 0.2–1.0)
MONOS PCT: 7 %
Neutro Abs: 6.8 10*3/uL — ABNORMAL HIGH (ref 1.4–6.5)
Neutrophils Relative %: 90 %
PLATELETS: 104 10*3/uL — AB (ref 150–440)
RBC: 5.46 MIL/uL (ref 4.40–5.90)
RDW: 13.8 % (ref 11.5–14.5)
WBC: 7.6 10*3/uL (ref 3.8–10.6)

## 2015-11-07 LAB — URINALYSIS COMPLETE WITH MICROSCOPIC (ARMC ONLY)
BILIRUBIN URINE: NEGATIVE
Bacteria, UA: NONE SEEN
Glucose, UA: NEGATIVE mg/dL
KETONES UR: NEGATIVE mg/dL
Leukocytes, UA: NEGATIVE
Nitrite: NEGATIVE
PROTEIN: 30 mg/dL — AB
Specific Gravity, Urine: 1.025 (ref 1.005–1.030)
pH: 5 (ref 5.0–8.0)

## 2015-11-07 LAB — LACTIC ACID, PLASMA: Lactic Acid, Venous: 1.3 mmol/L (ref 0.5–1.9)

## 2015-11-07 LAB — BAYER DCA HB A1C WAIVED: HB A1C: 5.3 % (ref <7.0)

## 2015-11-07 MED ORDER — SODIUM CHLORIDE 0.9 % IV BOLUS (SEPSIS)
500.0000 mL | Freq: Once | INTRAVENOUS | Status: AC
Start: 2015-11-07 — End: 2015-11-08
  Administered 2015-11-07: 500 mL via INTRAVENOUS

## 2015-11-07 MED ORDER — PROCHLORPERAZINE EDISYLATE 5 MG/ML IJ SOLN
10.0000 mg | Freq: Once | INTRAMUSCULAR | Status: AC
Start: 2015-11-07 — End: 2015-11-07
  Administered 2015-11-07: 10 mg via INTRAVENOUS
  Filled 2015-11-07: qty 2

## 2015-11-07 MED ORDER — ACETAMINOPHEN 325 MG PO TABS
650.0000 mg | ORAL_TABLET | Freq: Once | ORAL | Status: AC
  Administered 2015-11-07: 650 mg via ORAL
  Filled 2015-11-07: qty 2

## 2015-11-07 MED ORDER — FINASTERIDE 5 MG PO TABS: 5.0000 mg | ORAL_TABLET | Freq: Every day | ORAL | 3 refills | Status: DC

## 2015-11-07 NOTE — ED Notes (Signed)
AAOx3.  Skin warm and dry.  NAD.  C/O sinus pressure x 1 week and using nasal sprays.  Today, c/o pressure behind right eye.  Patient describes pressure as sinus pressure.

## 2015-11-07 NOTE — ED Provider Notes (Signed)
[facility] Emergency Department Provider Note  ____________________________________________   I have reviewed the triage vital signs and the nursing notes.   HISTORY  Chief Complaint Fever   History limited by: Not Limited   HPI [name] is a 75 y.o. male who presents to the emergency department today because of concerns for fever. The patient had his flu shot today at a doctor's appointment. He states that shortly after that he started having a fever. This was accompanied by headache. He took some Tylenol which seems to have helped to bring the fever down. Denies any chest pain or shortness of breath with this. He denies any nausea or vomiting. He states that he had a fever after a flu shot once 15 years ago.    Past Medical History:  Diagnosis Date  . Anterior urethral stricture   . Aortic ectasia (HCC)   . Atrial fibrillation (HCC)   . BPH (benign prostatic hyperplasia)   . Carotid artery occlusion   . CHF (congestive heart failure) (HCC)   . CKD (chronic kidney disease)   . Coronary artery disease   . Heart murmur   . Hematuria, microscopic   . Hyperlipemia   . Hypertension   . Kidney failure   . Phimosis/adherent prepuce   . Renal artery stenosis (HCC)   . UTI (lower urinary tract infection)     Patient Active Problem List   Diagnosis Date Noted  . Hyperlipidemia 11/07/2015  . BPH (benign prostatic hyperplasia) 11/07/2015  . Benign microscopic hematuria 06/27/2014  . Chronic kidney disease, stage III (moderate) 06/27/2014  . Obesity 06/27/2014  . 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 Coronary atherosclerosis of native coronary artery 06/27/2014  . CAD in native artery 06/27/2014  . Benign essential hematuria 06/27/2014  . Cardiorenal syndrome with renal failure 06/27/2014  . Aftercare following surgery of the circulatory system, NEC 06/01/2013  . Occlusion and stenosis of carotid artery without mention of  cerebral infarction 04/27/2012  . Carotid artery obstruction 04/27/2012    Past Surgical History:  Procedure Laterality Date  . CAROTID ENDARTERECTOMY  04/29/2006   right  . CAROTID ENDARTERECTOMY  12/09/2006   left  . CHOLECYSTECTOMY  2012   Gall Bladder  . COLONOSCOPY  05/05/2010  . CORONARY ARTERY BYPASS GRAFT  2008    Prior to Admission medications   Medication Sig Start Date End Date Taking? Authorizing Provider  aspirin 81 MG tablet Take 81 mg by mouth daily.    Historical Provider, MD  carvedilol (COREG) 25 MG tablet Take by mouth. Take 1/2 tablet every morning and 1 tablet every night    Historical Provider, MD  clopidogrel (PLAVIX) 75 MG tablet Take 1 tablet (75 mg total) by mouth daily. 05/26/15   Gabriel Cirriheryl Wicker, NP  finasteride (PROSCAR) 5 MG tablet Take 1 tablet (5 mg total) by mouth daily. 11/07/15   Particia Nearingachel Elizabeth Lane, PA-C  fluticasone [facility](FLONASE) 50 MCG/ACT nasal spray Place 2 sprays into the nose as needed. 02/18/15   Historical Provider, MD  rosuvastatin (CRESTOR) 40 MG tablet Take 1 tablet (40 mg total) by mouth daily. 07/22/15   Gabriel Cirriheryl Wicker, NP  sacubitril-valsartan (ENTRESTO) 24-26 MG Take 1 tablet by mouth 2 (two) times daily.    Historical Provider, MD    Allergies Review of patient's allergies indicates no known allergies.  Family History  Problem Relation Age of Onset  . Heart disease Mother     Before age 73  . Heart attack  Mother   . Cancer Mother     Breast  . Hyperlipidemia Mother   . Hypertension Mother   . Diabetes Mother   . Heart disease Father     Heart Disease before age 74  . Heart attack Father   . Hyperlipidemia Father   . Hypertension Father   . Cancer Sister     Breast  . Heart disease Sister     Before age 44  . Hyperlipidemia Sister   . Hypertension Sister   . Heart attack Sister   . Diabetes Sister   . Cancer Daughter     Colon    Social History Social History  Substance Use Topics  . Smoking status: Former Smoker     Types: Cigarettes    Quit date: 02/23/1992  . Smokeless tobacco: Never Used  . Alcohol use No    Review of Systems  Constitutional: Positive for fever. Cardiovascular: Negative for chest pain. Respiratory: Negative for shortness of breath. Gastrointestinal: Negative for abdominal pain, vomiting and diarrhea. Genitourinary: Negative for dysuria. Musculoskeletal: Negative for back pain. Skin: Negative for rash. Neurological: Positive for headache.   10-point ROS otherwise negative.  ____________________________________________   PHYSICAL EXAM:  VITAL SIGNS: ED Triage Vitals [11/07/15 1729]  Enc Vitals Group     BP (!) 170/77     Pulse Rate 79     Resp 20     Temp (!) 100.7 F (38.2 C)     Temp Source Oral     SpO2 96 %   Constitutional: Alert and oriented. Appears slightly uncomfortable. Eyes: Conjunctivae are normal. Normal extraocular movements. ENT   Head: Normocephalic and atraumatic.   Nose: No congestion/rhinnorhea.   Mouth/Throat: Mucous membranes are moist.   Neck: No stridor. Hematological/Lymphatic/Immunilogical: No cervical lymphadenopathy. Cardiovascular: Normal rate, regular rhythm.  No murmurs, rubs, or gallops. Respiratory: Normal respiratory effort without tachypnea nor retractions. Breath sounds are clear and equal bilaterally. No wheezes/rales/rhonchi. Gastrointestinal: Soft and nontender. No distention.  Genitourinary: Deferred Musculoskeletal: Normal range of motion in all extremities. No lower extremity edema. Neurologic:  Normal speech and language. No gross focal neurologic deficits are appreciated.  Skin:  Skin is warm, dry and intact. No rash noted. Psychiatric: Mood and affect are normal. Speech and behavior are normal. Patient exhibits appropriate insight and judgment.  ____________________________________________    LABS (pertinent positives/negatives)  Labs Reviewed  COMPREHENSIVE METABOLIC PANEL - Abnormal; Notable for  the following:       Result Value   Glucose, Bld 127 (*)    BUN 23 (*)    Creatinine, Ser 1.43 (*)    ALT 15 (*)    GFR calc non Af Amer 47 (*)    GFR calc Af Amer 55 (*)    All other components within normal limits  CBC WITH DIFFERENTIAL/PLATELET - Abnormal; Notable for the following:    Platelets 104 (*)    Neutro Abs 6.8 (*)    Lymphs Abs 0.2 (*)    All other components within normal limits  URINALYSIS COMPLETEWITH MICROSCOPIC ([facility] ONLY) - Abnormal; Notable for the following:    Color, Urine YELLOW (*)    APPearance CLEAR (*)    Hgb urine dipstick 2+ (*)    Protein, ur 30 (*)    Squamous Epithelial / LPF 0-5 (*)    All other components within normal limits  CULTURE, BLOOD (ROUTINE X 2)  CULTURE, BLOOD (ROUTINE X 2)  URINE CULTURE  LACTIC ACID, PLASMA  ____________________________________________   EKG  None  ____________________________________________    RADIOLOGY  CXR IMPRESSION:  Cardiomegaly without evidence of acute cardiopulmonary disease.     ____________________________________________   PROCEDURES  Procedures  ____________________________________________   INITIAL IMPRESSION / ASSESSMENT AND PLAN / ED COURSE  Pertinent labs & imaging results that were available during my care of the patient were reviewed by me and considered in my medical decision making (see chart for details).  Patient presented to the emergency department today because of concerns for fever. Patient did receive flu shot earlier today. Workup here without any concerning lactic acidosis, leukocytosis, findings on chest x-ray or urinalysis. At this point think fever could be related to recent flu shot versus viral illness. Patient also complaining of some headache this did improve after IV fluids and medications. At this point I doubt meningitis given lack of plan findings and patient's free movement of his neck. Patient also could easily touch his head to his chest  without any concerning pain. Did however discuss this with the family. At this point I think risk of lumbar puncture far outweighs the benefit. Did discuss return precautions. _____________________________   FINAL CLINICAL IMPRESSION(S) / ED DIAGNOSES  Final diagnoses:  Fever, unspecified fever cause     Note: This dictation was prepared with Dragon dictation. Any transcriptional errors that result from this process are unintentional    Phineas Semen, MD 11/08/15 0031

## 2015-11-07 NOTE — Discharge Instructions (Signed)
Please seek medical attention for any high fevers, chest pain, shortness of breath, change in behavior, persistent vomiting, bloody stool or any other new or concerning symptoms.  

## 2015-11-07 NOTE — ED Triage Notes (Signed)
Pt reports getting his flu shot earlier today and now is running a fever. Pt states he took some tylenol at 1620 today.

## 2015-11-07 NOTE — Assessment & Plan Note (Signed)
Stable. Followed by Cardiology, seeing them next week. Continue current regimen

## 2015-11-07 NOTE — Assessment & Plan Note (Signed)
Labs today, stable. Continue current regimen

## 2015-11-07 NOTE — Assessment & Plan Note (Signed)
Await labs, stable

## 2015-11-07 NOTE — Addendum Note (Signed)
Addended by: Marshall CorkBULLOCK, KERI L on: 11/07/2015 11:18 AM   Modules accepted: Orders

## 2015-11-07 NOTE — ED Triage Notes (Signed)
Called x1 for room 

## 2015-11-07 NOTE — Assessment & Plan Note (Signed)
Per cardiology, seeing them next week for f/u. Continue ASA and plavix.

## 2015-11-07 NOTE — Progress Notes (Signed)
BP 136/71 (BP Location: Left Arm, Patient Position: Sitting, Cuff Size: Normal)   Pulse (!) 44   Temp 98.4 F (36.9 C)   Ht 5\' 8"  (1.727 m)   Wt 237 lb 12.8 oz (107.9 kg)   SpO2 96%   BMI 36.16 kg/m    Subjective:    Patient ID: [name], male    DOB: 08-Nov-1940, 75 y.o.   MRN: [id number]  HPI: [name] is a 75 y.o. male presenting on 11/07/2015 for comprehensive medical examination. Current medical complaints include:none   Golfs two times weekly and works 4 hours a day to stay active. Does not follow any particular diet.   He currently lives with: wife Interim Problems from his last visit: no  Functional Status Survey: Is the patient deaf or have difficulty hearing?: Yes Does the patient have difficulty seeing, even when wearing glasses/contacts?: No Does the patient have difficulty concentrating, remembering, or making decisions?: No Does the patient have difficulty walking or climbing stairs?: No Does the patient have difficulty dressing or bathing?: No Does the patient have difficulty doing errands alone such as visiting a doctor's office or shopping?: No  FALL RISK: Fall Risk  11/07/2015  Falls in the past year? Yes  Number falls in past yr: 2 or more  Injury with Fall? No    Depression Screen Depression screen [facility] 2/9 11/07/2015 08/16/2014  Decreased Interest 0 0  Down, Depressed, Hopeless 0 0  PHQ - 2 Score 0 0    Advanced Directives <no information>  Past Medical History:  Past Medical History:  Diagnosis Date  . Anterior urethral stricture   . Aortic ectasia (HCC)   . Atrial fibrillation (HCC)   . BPH (benign prostatic hyperplasia)   . Carotid artery occlusion   . CHF (congestive heart failure) (HCC)   . CKD (chronic kidney disease)   . Coronary artery disease   . Heart murmur   . Hematuria, microscopic   . Hyperlipemia   . Hypertension   . Kidney failure   . Phimosis/adherent prepuce   . Renal artery stenosis (HCC)   . UTI  (lower urinary tract infection)     Surgical History:  Past Surgical History:  Procedure Laterality Date  . CAROTID ENDARTERECTOMY  04/29/2006   right  . CAROTID ENDARTERECTOMY  12/09/2006   left  . CHOLECYSTECTOMY  2012   Gall Bladder  . COLONOSCOPY  05/05/2010  . CORONARY ARTERY BYPASS GRAFT  2008    Medications:  Current Outpatient Prescriptions on File Prior to Visit  Medication Sig  . aspirin 81 MG tablet Take 81 mg by mouth daily.  . carvedilol (COREG) 25 MG tablet Take by mouth. Take 1/2 tablet every morning and 1 tablet every night  . clopidogrel (PLAVIX) 75 MG tablet Take 1 tablet (75 mg total) by mouth daily.  . rosuvastatin (CRESTOR) 40 MG tablet Take 1 tablet (40 mg total) by mouth daily.  . sacubitril-valsartan (ENTRESTO) 24-26 MG Take 1 tablet by mouth 2 (two) times daily.   No current facility-administered medications on file prior to visit.     Allergies:  No Known Allergies  Social History:  Social History   Social History  . Marital status: Married    Spouse name: N/A  . Number of children: N/A  . Years of education: N/A   Occupational History  . Not on file.   Social History Main Topics  . Smoking status: Former Smoker    Types: Cigarettes  Quit date: 02/23/1992  . Smokeless tobacco: Never Used  . Alcohol use No  . Drug use: No  . Sexual activity: No   Other Topics Concern  . Not on file   Social History Narrative  . No narrative on file   History  Smoking Status  . Former Smoker  . Types: Cigarettes  . Quit date: 02/23/1992  Smokeless Tobacco  . Never Used   History  Alcohol Use No    Family History:  Family History  Problem Relation Age of Onset  . Heart disease Mother     Before age 69  . Heart attack Mother   . Cancer Mother     Breast  . Hyperlipidemia Mother   . Hypertension Mother   . Diabetes Mother   . Heart disease Father     Heart Disease before age 79  . Heart attack Father   . Hyperlipidemia Father   .  Hypertension Father   . Cancer Sister     Breast  . Heart disease Sister     Before age 85  . Hyperlipidemia Sister   . Hypertension Sister   . Heart attack Sister   . Diabetes Sister   . Cancer Daughter     Colon    Past medical history, surgical history, medications, allergies, family history and social history reviewed with patient today and changes made to appropriate areas of the chart.   Review of Systems - General ROS: negative Psychological ROS: negative Ophthalmic ROS: negative ENT ROS: negative Respiratory ROS: no cough, shortness of breath, or wheezing Cardiovascular ROS: no chest pain or dyspnea on exertion Gastrointestinal ROS: no abdominal pain, change in bowel habits, or black or bloody stools Genito-Urinary ROS: no dysuria, trouble voiding, or hematuria negative Musculoskeletal ROS: positive for - arthritic hip pain Neurological ROS: no TIA or stroke symptoms Dermatological ROS: negative All other ROS negative except what is listed above and in the HPI.      Objective:    BP 136/71 (BP Location: Left Arm, Patient Position: Sitting, Cuff Size: Normal)   Pulse (!) 44   Temp 98.4 F (36.9 C)   Ht 5\' 8"  (1.727 m)   Wt 237 lb 12.8 oz (107.9 kg)   SpO2 96%   BMI 36.16 kg/m   Wt Readings from Last 3 Encounters:  11/07/15 237 lb 12.8 oz (107.9 kg)  06/09/15 245 lb (111.1 kg)  03/07/15 238 lb 11.2 oz (108.3 kg)    Physical Exam  Cognitive Testing - 6-CIT  Correct? Score   What year is it? yes 4 Yes = 0    No = 4  What month is it? yes 4 Yes = 0    No = 3  Remember:     Floyde Joseph, [address], [place]     What time is it? yes 4 Yes = 0    No = 3  Count backwards from 20 to 1 yes 4 Correct = 0    1 error = 2   More than 1 error = 4  Say the months of the year in reverse. yes 4 Correct = 0    1 error = 2   More than 1 error = 4  What address did I ask you to remember? yes 10 Correct = 0  1 error = 2    2 error = 4    3 error = 6    4 error = 8    All  wrong = 10       TOTAL SCORE  30/28   Interpretation:  Normal  Normal (0-7) Abnormal (8-28)      Assessment & Plan:   Problem List Items Addressed This Visit      Cardiovascular and Mediastinum   Hypertensive heart and renal disease    Stable. Followed by Cardiology, seeing them next week. Continue current regimen      Relevant Orders   CBC with Differential/Platelet   Coronary atherosclerosis of native coronary artery    Per cardiology, seeing them next week for f/u. Continue ASA and plavix.       Relevant Orders   Lipid Panel w/o Chol/HDL Ratio     Genitourinary   Chronic kidney disease, stage III (moderate) (Chronic)    Await labs, stable      Relevant Orders   Comprehensive metabolic panel   BPH (benign prostatic hyperplasia)    No new urinary symptoms, followed by Urology regularly. Continue finasteride.       Relevant Medications   finasteride (PROSCAR) 5 MG tablet     Other   Hyperlipidemia    Labs today, stable. Continue current regimen      Relevant Orders   Lipid Panel w/o Chol/HDL Ratio   TSH    Other Visit Diagnoses    Annual physical exam    -  Primary   Need for influenza vaccination       Relevant Orders   Flu vaccine HIGH DOSE PF (Fluzone High dose) (Completed)   Screening for diabetes mellitus       Relevant Orders   Bayer DCA Hb A1c Waived       Preventative Services:  Health Risk Assessment and Personalized Prevention Plan: Bone Mass Measurements: CVD Screening:  Colon Cancer Screening: UTD Depression Screening: done today Diabetes Screening: done today Flu Vaccine: done today Lung cancer Screening: completed 02/2014 Pneumonia Vaccines (2): UTD PSA screening: UTD  Discussed aspirin prophylaxis for myocardial infarction prevention and decision was made to continue ASA  LABORATORY TESTING:  Health maintenance labs ordered today as discussed above.   The natural history of prostate cancer and ongoing controversy regarding  screening and potential treatment outcomes of prostate cancer has been discussed with the patient. The meaning of a false positive PSA and a false negative PSA has been discussed. He indicates understanding of the limitations of this screening test and wishes to proceed with screening PSA testing. -- this is followed by Urology   IMMUNIZATIONS:   - Tdap: Tetanus vaccination status reviewed: last tetanus booster within 10 years. - Influenza: Administered today - Pneumovax: Up to date - Prevnar: Up to date - Zostavax vaccine: Refused  SCREENING: - Colonoscopy: Up to date  Discussed with patient purpose of the colonoscopy is to detect colon cancer at curable precancerous or early stages   PATIENT COUNSELING:    Sexuality: Discussed sexually transmitted diseases, partner selection, use of condoms, avoidance of unintended pregnancy  and contraceptive alternatives.   Advised to avoid cigarette smoking.  I discussed with the patient that most people either abstain from alcohol or drink within safe limits (<=14/week and <=4 drinks/occasion for males, <=7/weeks and <= 3 drinks/occasion for females) and that the risk for alcohol disorders and other health effects rises proportionally with the number of drinks per week and how often a drinker exceeds daily limits.  Discussed cessation/primary prevention of drug use and availability of treatment for abuse.   Diet: Encouraged to adjust caloric intake to maintain  or achieve ideal body weight, to reduce intake of dietary saturated fat and total fat, to limit sodium intake by avoiding high sodium foods and not adding table salt, and to maintain adequate dietary potassium and calcium preferably from fresh fruits, vegetables, and low-fat dairy products.    stressed the importance of regular exercise  Injury prevention: Discussed safety belts, safety helmets, smoke detector, smoking near bedding or upholstery.   Dental health: Discussed importance of  regular tooth brushing, flossing, and dental visits.   Follow up plan: NEXT PREVENTATIVE PHYSICAL DUE IN 1 YEAR. Return in about 6 months (around 05/06/2016) for Lipid, AST, ALT.

## 2015-11-07 NOTE — Assessment & Plan Note (Signed)
No new urinary symptoms, followed by Urology regularly. Continue finasteride.

## 2015-11-07 NOTE — Telephone Encounter (Signed)
FYI Patient's wife called stating he is now having a temp of 103, chills, body aches. Is very concerned about him and thinks he is getting the flu after having flu shot this morning. Advised her to alternate ibuprofen and tylenol, keep him well hydrated and have him rest. If needed, can go to UC or the ER. Wife states she will take him to the ER due to concern as he has previously had open heart surgery.

## 2015-11-08 LAB — COMPREHENSIVE METABOLIC PANEL
A/G RATIO: 2 (ref 1.2–2.2)
ALK PHOS: 45 IU/L (ref 39–117)
ALT: 11 IU/L (ref 0–44)
AST: 15 IU/L (ref 0–40)
Albumin: 4.2 g/dL (ref 3.5–4.8)
BUN/Creatinine Ratio: 15 (ref 10–24)
BUN: 19 mg/dL (ref 8–27)
Bilirubin Total: 0.4 mg/dL (ref 0.0–1.2)
CHLORIDE: 101 mmol/L (ref 96–106)
CO2: 20 mmol/L (ref 18–29)
Calcium: 9.2 mg/dL (ref 8.6–10.2)
Creatinine, Ser: 1.28 mg/dL — ABNORMAL HIGH (ref 0.76–1.27)
GFR calc Af Amer: 64 mL/min/{1.73_m2} (ref >59)
GFR calc non Af Amer: 55 mL/min/{1.73_m2} — ABNORMAL LOW (ref >59)
GLOBULIN, TOTAL: 2.1 g/dL (ref 1.5–4.5)
Glucose: 103 mg/dL — ABNORMAL HIGH (ref 65–99)
POTASSIUM: 4.3 mmol/L (ref 3.5–5.2)
SODIUM: 138 mmol/L (ref 134–144)
Total Protein: 6.3 g/dL (ref 6.0–8.5)

## 2015-11-08 LAB — LIPID PANEL W/O CHOL/HDL RATIO
CHOLESTEROL TOTAL: 94 mg/dL — AB (ref 100–199)
HDL: 29 mg/dL — ABNORMAL LOW (ref >39)
LDL Calculated: 36 mg/dL (ref 0–99)
TRIGLYCERIDES: 145 mg/dL (ref 0–149)
VLDL Cholesterol Cal: 29 mg/dL (ref 5–40)

## 2015-11-08 LAB — CBC WITH DIFFERENTIAL/PLATELET
BASOS: 0 %
Basophils Absolute: 0 10*3/uL (ref 0.0–0.2)
EOS (ABSOLUTE): 0.2 10*3/uL (ref 0.0–0.4)
EOS: 3 %
HEMATOCRIT: 44.5 % (ref 37.5–51.0)
Hemoglobin: 15.2 g/dL (ref 12.6–17.7)
IMMATURE GRANS (ABS): 0 10*3/uL (ref 0.0–0.1)
Immature Granulocytes: 0 %
LYMPHS: 19 %
Lymphocytes Absolute: 1 10*3/uL (ref 0.7–3.1)
MCH: 30.3 pg (ref 26.6–33.0)
MCHC: 34.2 g/dL (ref 31.5–35.7)
MCV: 89 fL (ref 79–97)
MONOS ABS: 0.6 10*3/uL (ref 0.1–0.9)
Monocytes: 11 %
Neutrophils Absolute: 3.4 10*3/uL (ref 1.4–7.0)
Neutrophils: 67 %
Platelets: 108 10*3/uL — ABNORMAL LOW (ref 150–379)
RBC: 5.02 x10E6/uL (ref 4.14–5.80)
RDW: 13.3 % (ref 12.3–15.4)
WBC: 5.1 10*3/uL (ref 3.4–10.8)

## 2015-11-08 LAB — TSH: TSH: 1.97 u[IU]/mL (ref 0.450–4.500)

## 2015-11-08 IMAGING — CR DG CHEST 2V
1 series · 2 of 2 positions shown · non-contrast
Comparison: CT of 5 days ago.  Plain film of 10/10/2016

CLINICAL DATA: Initial encounter for nonproductive cough for 2
weeks.

EXAM:
CHEST  2 VIEW

[Series 1: pa · 0.17mm/px · 2 of 2 slices shown]
[im 1/2]
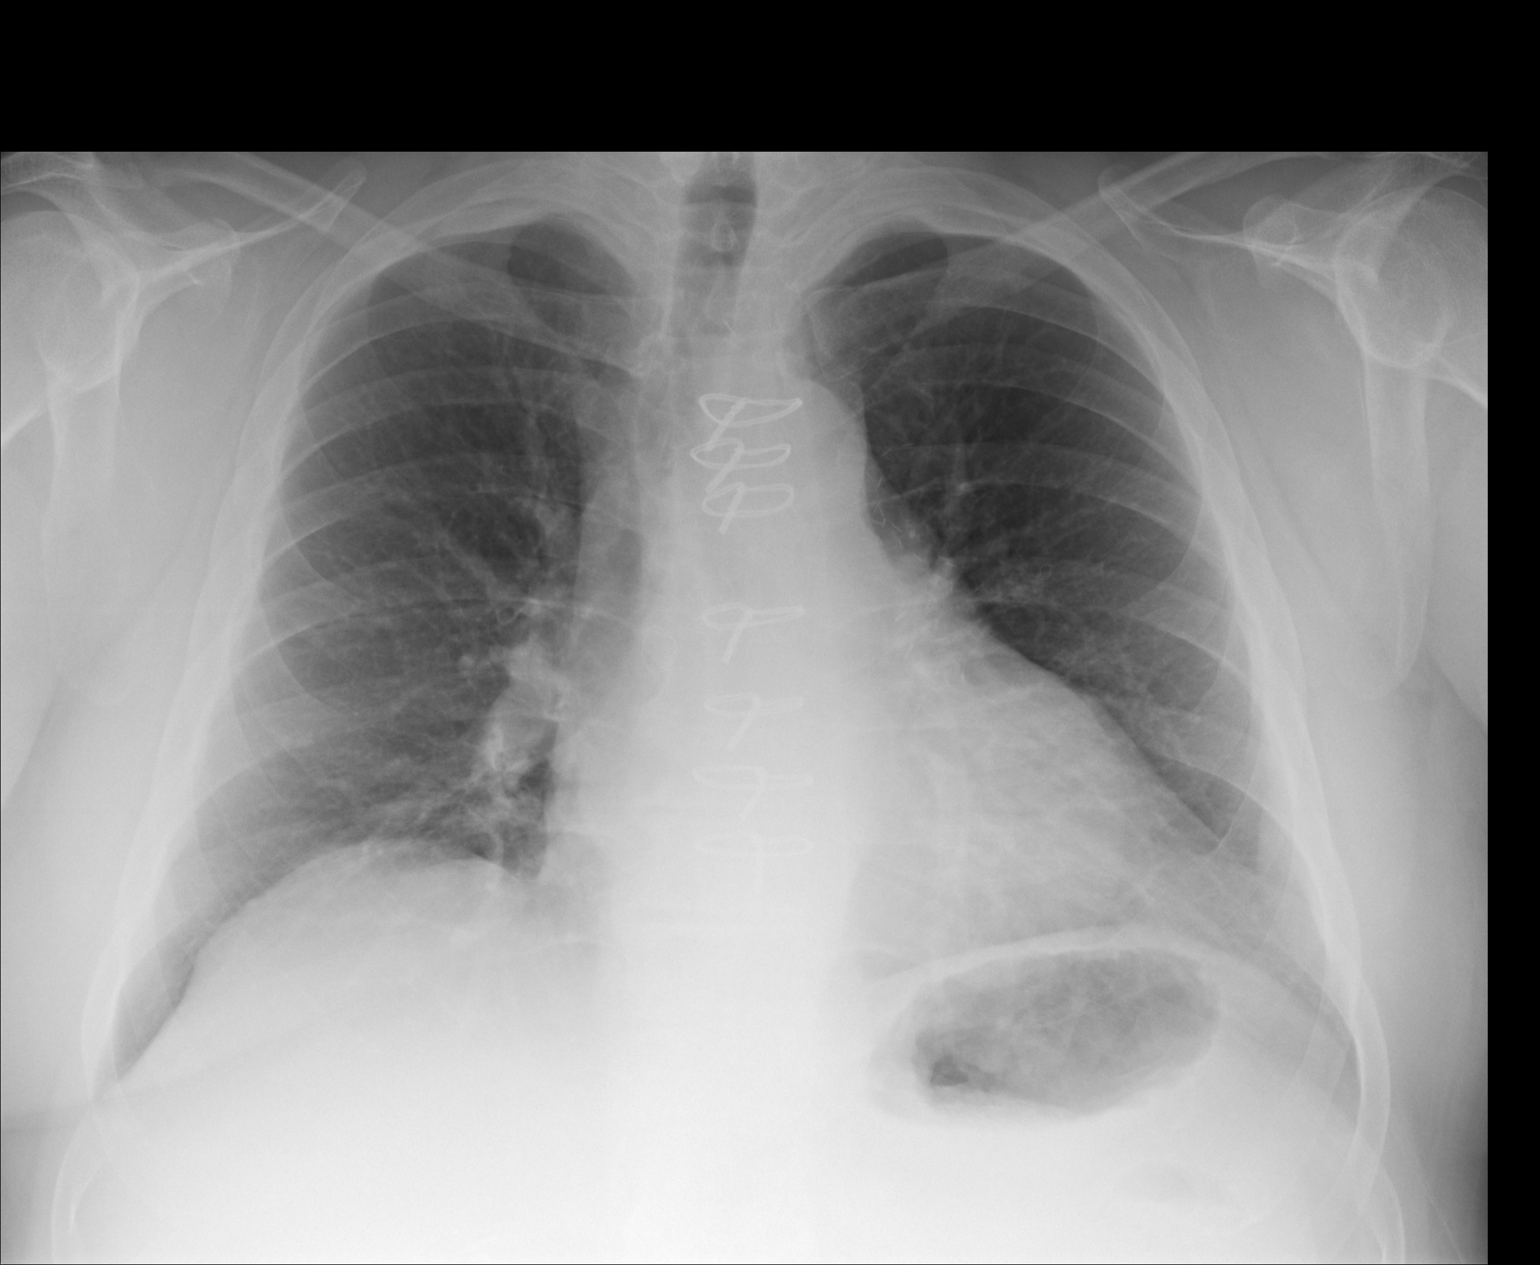
[im 2/2]
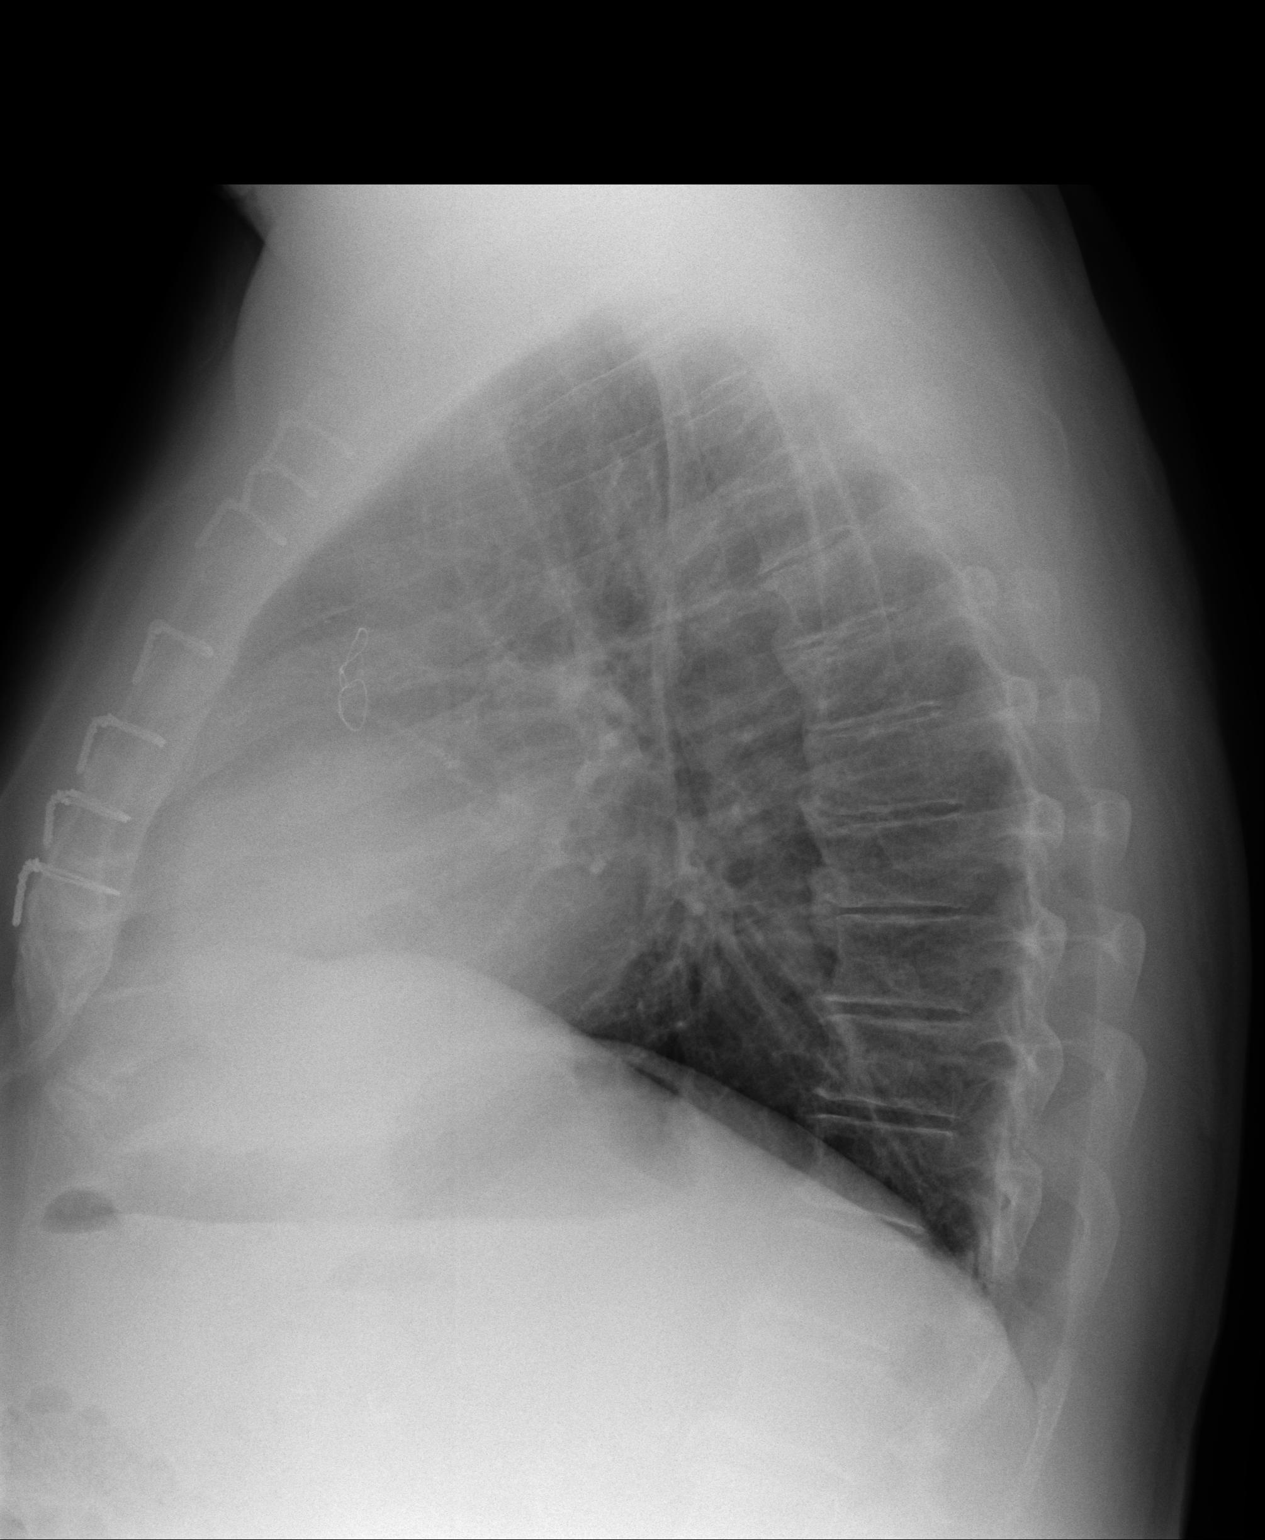

[2 of 2 positions shown; findings below may reference images not displayed]

FINDINGS: Prior median sternotomy. Hyperinflation. Midline trachea. Mild
cardiomegaly with a tortuous thoracic aorta. No pleural effusion or
pneumothorax. Clear lungs.
IMPRESSION: Hyperinflation cardiomegaly.  No acute superimposed process.

## 2015-11-09 LAB — URINE CULTURE: Culture: <10000 — AB

## 2015-11-10 ENCOUNTER — Encounter: Payer: Self-pay | Admitting: Family Medicine

## 2015-11-12 LAB — CULTURE, BLOOD (ROUTINE X 2)
CULTURE: NO GROWTH
CULTURE: NO GROWTH

## 2016-02-19 ENCOUNTER — Other Ambulatory Visit: Payer: Self-pay | Admitting: Unknown Physician Specialty

## 2016-03-01 ENCOUNTER — Other Ambulatory Visit: Payer: Self-pay | Admitting: Family Medicine

## 2016-03-01 DIAGNOSIS — N400 Enlarged prostate without lower urinary tract symptoms: Secondary | ICD-10-CM

## 2016-03-01 NOTE — Telephone Encounter (Signed)
Routing to provider  

## 2016-03-12 ENCOUNTER — Ambulatory Visit: Payer: Medicare Other | Admitting: Urology

## 2016-04-12 ENCOUNTER — Other Ambulatory Visit: Payer: Self-pay | Admitting: Family Medicine

## 2016-04-12 DIAGNOSIS — N400 Enlarged prostate without lower urinary tract symptoms: Secondary | ICD-10-CM

## 2016-05-07 ENCOUNTER — Ambulatory Visit: Payer: Medicare Other | Admitting: Urology

## 2016-05-07 ENCOUNTER — Encounter: Payer: Self-pay | Admitting: Urology

## 2016-05-07 VITALS — BP 149/73 | HR 57 | Ht 68.0 in | Wt 242.4 lb

## 2016-05-07 DIAGNOSIS — N400 Enlarged prostate without lower urinary tract symptoms: Secondary | ICD-10-CM | POA: Diagnosis not present

## 2016-05-07 DIAGNOSIS — Z87448 Personal history of other diseases of urinary system: Secondary | ICD-10-CM | POA: Diagnosis not present

## 2016-05-07 LAB — BLADDER SCAN AMB NON-IMAGING: Scan Result: 81

## 2016-05-07 NOTE — Progress Notes (Signed)
Uroflow  Peak Flow: 10.520ml Average Flow: 7.617ml Voided Volume: 325.480ml Voiding Time: 50.7sec Flow Time: 42.3sec Time to Peak Flow: 19.9sec  PVR Volume: 81ml

## 2016-05-07 NOTE — Progress Notes (Signed)
05/07/2016 9:58 AM   [name] 1940/12/01 [id number]  Referring provider: Gabriel Cirri, NP [address], [place] [address]  Chief Complaint  Patient presents with  . Follow-up    HPI: 76 History of pendulous urethral stricture status post DVIU 2015, phimosis status post circumcision, and BPH on finasteride.  He returns today for annual routine follow-up.  He also has a history of microscopic hematuria status post negative workup 12/2013.  Overall, he string very well. He feels that he is able to empty his bladder to stream remains significantly improved since his DVIU. He feels that he is able to empty his bladder completely. No urinary tract infections. He has not get up at night to void. He is very pleased overall.  Uroflow today with somewhat obstructive voiding pattern, Qmax 10 mL's per second but essentially unchanged from last year.   PVR less than 100 cc.  Most recent PSA 1 year ago 0.4.  Most recent rectal exam last year, mildly enlarged 40 cc without nodules.   PMH:  Past Medical History:  Diagnosis Date  . Anterior urethral stricture   . Aortic ectasia (HCC)   . Atrial fibrillation (HCC)   . BPH (benign prostatic hyperplasia)   . Carotid artery occlusion   . CHF (congestive heart failure) (HCC)   . CKD (chronic kidney disease)   . Coronary artery disease   . Heart murmur   . Hematuria, microscopic   . Hyperlipemia   . Hypertension   . Kidney failure   . Phimosis/adherent prepuce   . Renal artery stenosis (HCC)   . UTI (lower urinary tract infection)     Surgical History: Past Surgical History:  Procedure Laterality Date  . CAROTID ENDARTERECTOMY  04/29/2006   right  . CAROTID ENDARTERECTOMY  12/09/2006   left  . CHOLECYSTECTOMY  2012   Gall Bladder  . COLONOSCOPY  05/05/2010  . CORONARY ARTERY BYPASS GRAFT  2008    Home Medications:  Allergies as of 05/07/2016   No Known Allergies     Medication List       Accurate as of 05/07/16   9:58 AM. Always use your most recent med list.          aspirin 81 MG tablet Take 81 mg by mouth daily.   carvedilol 25 MG tablet Commonly known as:  COREG Take by mouth. Take 1/2 tablet every morning and 1 tablet every night   clopidogrel 75 MG tablet Commonly known as:  PLAVIX Take 1 tablet (75 mg total) by mouth daily.   ENTRESTO 24-26 MG Generic drug:  sacubitril-valsartan Take 1 tablet by mouth 2 (two) times daily.   finasteride 5 MG tablet Commonly known as:  PROSCAR TAKE 1 TABLET DAILY.   fluticasone 50 MCG/ACT nasal spray Commonly known as:  FLONASE Place 2 sprays into the nose as needed.   rosuvastatin 40 MG tablet Commonly known as:  CRESTOR Take 1 tablet (40 mg total) by mouth daily.       Allergies: No Known Allergies  Family History: Family History  Problem Relation Age of Onset  . Heart disease Mother     Before age 45  . Heart attack Mother   . Cancer Mother     Breast  . Hyperlipidemia Mother   . Hypertension Mother   . Diabetes Mother   . Heart disease Father     Heart Disease before age 104  . Heart attack Father   . Hyperlipidemia Father   .  Hypertension Father   . Cancer Sister     Breast  . Heart disease Sister     Before age 74  . Hyperlipidemia Sister   . Hypertension Sister   . Heart attack Sister   . Diabetes Sister   . Cancer Daughter     Colon    Social History:  reports that he quit smoking about 24 years ago. His smoking use included Cigarettes. He has never used smokeless tobacco. He reports that he does not drink alcohol or use drugs.  ROS: UROLOGY Frequent Urination?: No Hard to postpone urination?: No Burning/pain with urination?: No Get up at night to urinate?: No Leakage of urine?: No Urine stream starts and stops?: No Trouble starting stream?: No Do you have to strain to urinate?: No Blood in urine?: No Urinary tract infection?: No Sexually transmitted disease?: No Injury to kidneys or bladder?:  No Painful intercourse?: No Weak stream?: No Erection problems?: No Penile pain?: No  Gastrointestinal Nausea?: No Vomiting?: No Indigestion/heartburn?: No Diarrhea?: No Constipation?: No  Constitutional Fever: No Night sweats?: No Weight loss?: No Fatigue?: No  Skin Skin rash/lesions?: No Itching?: No  Eyes Blurred vision?: No Double vision?: No  Ears/Nose/Throat Sore throat?: No Sinus problems?: Yes  Hematologic/Lymphatic Swollen glands?: No Easy bruising?: Yes  Cardiovascular Leg swelling?: No Chest pain?: No  Respiratory Cough?: Yes Shortness of breath?: Yes  Endocrine Excessive thirst?: No  Musculoskeletal Back pain?: No Joint pain?: No  Neurological Headaches?: No Dizziness?: No  Psychologic Depression?: No Anxiety?: No  Physical Exam: BP (!) 149/73 (BP Location: Left Arm, Patient Position: Sitting, Cuff Size: Normal)   Pulse (!) 57   Ht 5\' 8"  (1.727 m)   Wt 242 lb 6.4 oz (110 kg)   BMI 36.86 kg/m   Constitutional:  Alert and oriented, No acute distress. HEENT: [place] AT, moist mucus membranes.  Trachea midline, no masses. Cardiovascular: No clubbing, cyanosis, or edema. Respiratory: Normal respiratory effort, no increased work of breathing. GI: Abdomen is soft, nontender, nondistended, no abdominal masses GU: No CVA tenderness.   Skin: No rashes, bruises or suspicious lesions. Neurologic: Grossly intact, no focal deficits, moving all 4 extremities. Psychiatric: Normal mood and affect.  Laboratory Data: Lab Results  Component Value Date   WBC 7.6 11/07/2015   HGB 16.6 11/07/2015   HCT 47.9 11/07/2015   MCV 87.7 11/07/2015   PLT 104 (L) 11/07/2015    Lab Results  Component Value Date   CREATININE 1.43 (H) 11/07/2015  Stable at baseline  Component     Latest Ref Rng & Units 03/07/2015  PSA     0.0 - 4.0 ng/mL 0.4    Results for orders placed or performed in visit on 05/07/16  Bladder Scan (Post Void Residual) in office   Result Value Ref Range   Scan Result 81     Pertinent Imaging: Uroflow Obstructive appearing voiding curve   Peak Flow: 10ml Average Flow: 7.54ml Voided Volume: Voiding Time: 50.7sec Flow Time: 42.3sec Time to Peak Flow: 19.9sec  PVR Volume: 81 ml  Assessment & Plan:     1. Benign prostatic hyperplasia without lower urinary tract symptoms Stable and finasteride, continue this medication indefinitely Recommend occasional PSA in setting of controversial study indicating possible increased risk for high risk prostate cancer with chronic finasteride In general, PSA screening is discouraged after age 30 or life expectancy less than 10 years Given that his symptoms are well controlled, I have released him to his PCP and he  was advised to follow up as needed should his symptoms worsen - PSA - Bladder Scan (Post Void Residual) in office  2. History of urethral stricture Signs and symptoms of recurrent urethral stricture were reviewed today in detail, patient will return as needed   Return if symptoms worsen or fail to improve.  Vanna ScotlandAshley Hadiya Spoerl, MD  [facility] [address], [address] [place], [place] [id number]

## 2016-05-08 LAB — PSA: PROSTATE SPECIFIC AG, SERUM: 0.3 ng/mL (ref 0.0–4.0)

## 2016-05-10 ENCOUNTER — Telehealth: Payer: Self-pay

## 2016-05-10 NOTE — Telephone Encounter (Signed)
Spoke with pt in reference to PSA results. Pt voiced understanding.  

## 2016-05-10 NOTE — Telephone Encounter (Signed)
-----   Message from Vanna ScotlandAshley Brandon, MD sent at 05/10/2016  9:27 AM EDT ----- PSA is excellent.  Vanna ScotlandAshley Brandon, MD

## 2016-05-13 ENCOUNTER — Other Ambulatory Visit: Payer: Self-pay | Admitting: Family Medicine

## 2016-06-04 ENCOUNTER — Encounter: Payer: Self-pay | Admitting: Family

## 2016-06-04 ENCOUNTER — Ambulatory Visit (INDEPENDENT_AMBULATORY_CARE_PROVIDER_SITE_OTHER): Payer: Medicare Other | Admitting: Family

## 2016-06-04 ENCOUNTER — Ambulatory Visit (HOSPITAL_COMMUNITY)
Admission: RE | Admit: 2016-06-04 | Discharge: 2016-06-04 | Disposition: A | Discharge status: Home / Self Care | Payer: Medicare Other | Source: Ambulatory Visit | Attending: Family | Admitting: Family

## 2016-06-04 VITALS — BP 118/78 | HR 36 | Temp 97.3°F | Resp 20 | Ht 69.0 in | Wt 240.0 lb

## 2016-06-04 DIAGNOSIS — Z9889 Other specified postprocedural states: Secondary | ICD-10-CM

## 2016-06-04 DIAGNOSIS — I6523 Occlusion and stenosis of bilateral carotid arteries: Secondary | ICD-10-CM

## 2016-06-04 DIAGNOSIS — Z87891 Personal history of nicotine dependence: Secondary | ICD-10-CM

## 2016-06-04 LAB — VAS US CAROTID
LCCADSYS: -118 cm/s
LCCAPSYS: 127 cm/s
LEFT ECA DIAS: -5 cm/s
LEFT VERTEBRAL DIAS: -9 cm/s
LICADDIAS: -29 cm/s
LICADSYS: -86 cm/s
LICAPDIAS: -8 cm/s
Left CCA dist dias: -18 cm/s
Left CCA prox dias: 20 cm/s
Left ICA prox sys: -78 cm/s
RCCADSYS: -83 cm/s
RCCAPDIAS: 12 cm/s
RIGHT CCA MID DIAS: 17 cm/s
RIGHT ECA DIAS: -10 cm/s
RIGHT VERTEBRAL DIAS: -10 cm/s
Right CCA prox sys: 99 cm/s

## 2016-06-04 NOTE — Progress Notes (Signed)
Chief Complaint: Follow up Extracranial Carotid Artery Stenosis   History of Present Illness  [name] is a 76 y.o. male patient of Dr. Myra Gianotti who is status post right CEA in March 2008, left CEA in October 2008 by Dr. Madilyn Fireman. He returns today for follow up.  He gets 4 hours walking into his daily routine on his job, 5 days/week, also plays golf twice/week. He denies claudication symptoms in his legs with walking. 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  Pt states his cardiologist in [place], Dr. Park Breed, is adjusting his medications to address his asymptomatic bradycardia.   Patient denies any history of TIA or stroke symptoms.Specifically he denies a history of amaurosis fugax or monocular blindness, unilateral facial drooping, hemiparesis, or receptive or expressive aphasia.  He denies tingling, numbness, weakness, or pain in either hand/arm, denies dizziness. He stopped drinking [employer].   Pt Diabetic: No Pt smoker: former smoker, quit in 1994  Pt meds include: Statin : Yes ASA: Yes Other anticoagulants/antiplatelets: Plavix started after the CABG      Past Medical History:  Diagnosis Date  . Anterior urethral stricture   . Aortic ectasia (HCC)   . Atrial fibrillation (HCC)   . BPH (benign prostatic hyperplasia)   . Carotid artery occlusion   . CHF (congestive heart failure) (HCC)   . CKD (chronic kidney disease)   . Coronary artery disease   . Heart murmur   . Hematuria, microscopic   . Hyperlipemia   . Hypertension   . Kidney failure   . Phimosis/adherent prepuce   . Renal artery stenosis (HCC)   . UTI (lower urinary tract infection)     Social History Social History  Substance Use Topics  . Smoking status: Former Smoker    Types: Cigarettes    Quit date: 02/23/1992  . Smokeless tobacco: Never Used  . Alcohol use No    Family History Family History  Problem Relation Age of Onset  . Heart disease Mother    Before age 56  . Heart attack Mother   . Cancer Mother     Breast  . Hyperlipidemia Mother   . Hypertension Mother   . Diabetes Mother   . Heart disease Father     Heart Disease before age 50  . Heart attack Father   . Hyperlipidemia Father   . Hypertension Father   . Cancer Sister     Breast  . Heart disease Sister     Before age 39  . Hyperlipidemia Sister   . Hypertension Sister   . Heart attack Sister   . Diabetes Sister   . Cancer Daughter     Colon    Surgical History Past Surgical History:  Procedure Laterality Date  . CAROTID ENDARTERECTOMY  04/29/2006   right  . CAROTID ENDARTERECTOMY  12/09/2006   left  . CHOLECYSTECTOMY  2012   Gall Bladder  . COLONOSCOPY  05/05/2010  . CORONARY ARTERY BYPASS GRAFT  2008    No Known Allergies  Current Outpatient Prescriptions  Medication Sig Dispense Refill  . aspirin 81 MG tablet Take 81 mg by mouth daily.    . carvedilol (COREG) 25 MG tablet Take by mouth. Take 1/2 tablet every morning and 1 tablet every night    . clopidogrel (PLAVIX) 75 MG tablet Take 1 tablet (75 mg total) by mouth daily. 90 tablet 0  . finasteride (PROSCAR) 5 MG tablet TAKE 1 TABLET DAILY. 30  tablet 0  . rosuvastatin (CRESTOR) 40 MG tablet Take 1 tablet (40 mg total) by mouth daily. 90 tablet 3  . sacubitril-valsartan (ENTRESTO) 24-26 MG Take 1 tablet by mouth 2 (two) times daily.    . fluticasone (FLONASE) 50 MCG/ACT nasal spray Place 2 sprays into the nose as needed.     No current facility-administered medications for this visit.     Review of Systems : See HPI for pertinent positives and negatives.  Physical Examination  Vitals:   06/04/16 1009 06/04/16 1015  BP: (!) 125/55 118/78  Pulse: (!) 42 (!) 36  Resp: 20   Temp: 97.3 F (36.3 C)   TempSrc: Oral   SpO2: 97%   Weight: 240 lb (108.9 kg)   Height:  (1.753 m)    Body mass index is 35.44 kg/m.  General: WDWN obese male in NAD GAIT: normal Eyes: PERRLA Pulmonary:  Non-labored respirations, CTAB with adequate air movement.  Cardiac: regular rhythm, no detected murmur. Palpable pedal pulses. Trace pitting edema in left ankle.  VASCULAR EXAM Carotid Bruits Left Right   negative negative    Radial pulses are 2+ palpable and equal. Bilateral DP and PT pulses are palpable.      Gastrointestinal: soft, nontender, BS WNL, no r/g, no palpable masses.  Musculoskeletal: No muscle atrophy/wasting. M/S 5/5 throughout, Extremities without ischemic changes.  Neurologic: A&O X 3; Appropriate Affect, Speech is normal CN 2-12 intact, Pain and light touch intact in extremities, Motor exam as listed above    Assessment: [name] is a 76 y.o. male who is status post right CEA in March 2008, left CEA in October 2008. He has no history of stroke or TIA.  DATA Today's carotid Duplex suggests bilateral carotid endarterectomy sites with no evidence for restenosis.  Bilateral vertebral artery flow is antegrade.  Bilateral subclavian artery waveforms are normal.  No significant change since prior exam on 06-09-15.   Plan: Follow-up in 18 months with Carotid Duplex scan.    I discussed in depth with the patient the nature of atherosclerosis, and emphasized the importance of maximal medical management including strict control of blood pressure, blood glucose, and lipid levels, obtaining regular exercise, and continued cessation of smoking.  The patient is aware that without maximal medical management the underlying atherosclerotic disease process will progress, limiting the benefit of any interventions. The patient was given information about stroke prevention and what symptoms should prompt the patient to seek immediate medical care. Thank you for allowing [place] to participate in this patient's care.  Charisse March,  RN, MSN, FNP-C [facility] Office: [phone]  Clinic Physician: Imogene Burn  06/04/16 10:29 AM

## 2016-06-04 NOTE — Patient Instructions (Signed)
Stroke Prevention Some medical conditions and behaviors are associated with an increased chance of having a stroke. You may prevent a stroke by making healthy choices and managing medical conditions. How can I reduce my risk of having a stroke?  Stay physically active. Get at least 30 minutes of activity on most or all days.  Do not smoke. It may also be helpful to avoid exposure to secondhand smoke.  Limit alcohol use. Moderate alcohol use is considered to be:  No more than 2 drinks per day for men.  No more than 1 drink per day for nonpregnant women.  Eat healthy foods. This involves:  Eating 5 or more servings of fruits and vegetables a day.  Making dietary changes that address high blood pressure (hypertension), high cholesterol, diabetes, or obesity.  Manage your cholesterol levels.  Making food choices that are high in fiber and low in saturated fat, trans fat, and cholesterol may control cholesterol levels.  Take any prescribed medicines to control cholesterol as directed by your health care provider.  Manage your diabetes.  Controlling your carbohydrate and sugar intake is recommended to manage diabetes.  Take any prescribed medicines to control diabetes as directed by your health care provider.  Control your hypertension.  Making food choices that are low in salt (sodium), saturated fat, trans fat, and cholesterol is recommended to manage hypertension.  Ask your health care provider if you need treatment to lower your blood pressure. Take any prescribed medicines to control hypertension as directed by your health care provider.  If you are 18-39 years of age, have your blood pressure checked every 3-5 years. If you are 40 years of age or older, have your blood pressure checked every year.  Maintain a healthy weight.  Reducing calorie intake and making food choices that are low in sodium, saturated fat, trans fat, and cholesterol are recommended to manage  weight.  Stop drug abuse.  Avoid taking birth control pills.  Talk to your health care provider about the risks of taking birth control pills if you are over 35 years old, smoke, get migraines, or have ever had a blood clot.  Get evaluated for sleep disorders (sleep apnea).  Talk to your health care provider about getting a sleep evaluation if you snore a lot or have excessive sleepiness.  Take medicines only as directed by your health care provider.  For some people, aspirin or blood thinners (anticoagulants) are helpful in reducing the risk of forming abnormal blood clots that can lead to stroke. If you have the irregular heart rhythm of atrial fibrillation, you should be on a blood thinner unless there is a good reason you cannot take them.  Understand all your medicine instructions.  Make sure that other conditions (such as anemia or atherosclerosis) are addressed. Get help right away if:  You have sudden weakness or numbness of the face, arm, or leg, especially on one side of the body.  Your face or eyelid droops to one side.  You have sudden confusion.  You have trouble speaking (aphasia) or understanding.  You have sudden trouble seeing in one or both eyes.  You have sudden trouble walking.  You have dizziness.  You have a loss of balance or coordination.  You have a sudden, severe headache with no known cause.  You have new chest pain or an irregular heartbeat. Any of these symptoms may represent a serious problem that is an emergency. Do not wait to see if the symptoms will go away.   Get medical help at once. Call your local emergency services (911 in [place] Do not drive yourself to the hospital. This information is not intended to replace advice given to you by your health care provider. Make sure you discuss any questions you have with your health care provider. Document Released: 03/18/2004 Document Revised: 07/17/2015 Document Reviewed: 08/11/2012 Elsevier  Interactive Patient Education  2017 Elsevier Inc.      Preventing Cerebrovascular Disease Arteries are blood vessels that carry blood that contains oxygen from the heart to all parts of the body. Cerebrovascular disease affects arteries that supply the brain. Any condition that blocks or disrupts blood flow to the brain can cause cerebrovascular disease. Brain cells that lose blood supply start to die within minutes (stroke). Stroke is the main danger of cerebrovascular disease. Atherosclerosis and high blood pressure are common causes of cerebrovascular disease. Atherosclerosis is narrowing and hardening of an artery that results when fat, cholesterol, calcium, or other substances (plaque) build up inside an artery. Plaque reduces blood flow through the artery. High blood pressure increases the risk of bleeding inside the brain. Making diet and lifestyle changes to prevent atherosclerosis and high blood pressure lowers your risk of cerebrovascular disease. What nutrition changes can be made?  Eat more fruits, vegetables, and whole grains.  Reduce how much saturated fat you eat. To do this, eat less red meat and fewer full-fat dairy products.  Eat healthy proteins instead of red meat. Healthy proteins include:  Fish. Eat fish that contains heart-healthy omega-3 fatty acids, twice a week. Examples include salmon, albacore tuna, mackerel, and herring.  Chicken.  Nuts.  Low-fat or nonfat yogurt.  Avoid processed meats, like bacon and lunchmeat.  Avoid foods that contain:  A lot of sugar, such as sweets and drinks with added sugar.  A lot of salt (sodium). Avoid adding extra salt to your food, as told by your health care provider.  Trans fats, such as margarine and baked goods. Trans fats may be listed as "partially hydrogenated oils" on food labels.  Check food labels to see how much sodium, sugar, and trans fats are in foods.  Use vegetable oils that contain low amounts of  saturated fat, such as olive oil or canola oil. What lifestyle changes can be made?  Drink alcohol in moderation. This means no more than 1 drink a day for nonpregnant women and 2 drinks a day for men. One drink equals 12 oz of beer, 5 oz of wine, or 1 oz of hard liquor.  If you are overweight, ask your health care provider to recommend a weight-loss plan for you. Losing 5-10 lb (2.2-4.5 kg) can reduce your risk of diabetes, atherosclerosis, and high blood pressure.  Exercise for 30?60 minutes on most days, or as much as told by your health care provider.  Do moderate-intensity exercise, such as brisk walking, bicycling, and water aerobics. Ask your health care provider which activities are safe for you.  Do not use any products that contain nicotine or tobacco, such as cigarettes and e-cigarettes. If you need help quitting, ask your health care provider. Why are these changes important? Making these changes lowers your risk of many diseases that can cause cerebrovascular disease and stroke. Stroke is a leading cause of death and disability. Making these changes also improves your overall health and quality of life. What can I do to lower my risk? The following factors make you more likely to develop cerebrovascular disease:  Being overweight.  Smoking.  Being physically   inactive.  Eating a high-fat diet.  Having certain health conditions, such as:  Diabetes.  High blood pressure.  Heart disease.  Atherosclerosis.  High cholesterol.  Sickle cell disease. Talk with your health care provider about your risk for cerebrovascular disease. Work with your health care provider to control diseases that you have that may contribute to cerebrovascular disease. Your health care provider may prescribe medicines to help prevent major causes of cerebrovascular disease. Where to find more information: Learn more about preventing cerebrovascular disease from:  National Heart, Lung, and  Blood Institute: www.nhlbi.nih.gov/health/health-topics/topics/stroke  Centers for Disease Control and Prevention: cdc.gov/stroke/about.htm Summary  Cerebrovascular disease can lead to a stroke.  Atherosclerosis and high blood pressure are major causes of cerebrovascular disease.  Making diet and lifestyle changes can reduce your risk of cerebrovascular disease.  Work with your health care provider to get your risk factors under control to reduce your risk of cerebrovascular disease. This information is not intended to replace advice given to you by your health care provider. Make sure you discuss any questions you have with your health care provider. Document Released: 02/23/2015 Document Revised: 08/29/2015 Document Reviewed: 02/23/2015 Elsevier Interactive Patient Education  2017 Elsevier Inc.  

## 2016-06-12 ENCOUNTER — Other Ambulatory Visit: Payer: Self-pay | Admitting: Family Medicine

## 2016-06-12 DIAGNOSIS — N400 Enlarged prostate without lower urinary tract symptoms: Secondary | ICD-10-CM

## 2016-06-14 ENCOUNTER — Encounter (HOSPITAL_COMMUNITY): Payer: Medicare Other

## 2016-06-14 ENCOUNTER — Ambulatory Visit: Payer: Medicare Other | Admitting: Family

## 2016-06-15 ENCOUNTER — Emergency Department
Admission: EM | Admit: 2016-06-15 | Discharge: 2016-06-15 | Disposition: A | Discharge status: Home / Self Care | Payer: Medicare Other | Attending: Emergency Medicine | Admitting: Emergency Medicine

## 2016-06-15 ENCOUNTER — Encounter: Payer: Self-pay | Admitting: Emergency Medicine

## 2016-06-15 DIAGNOSIS — I509 Heart failure, unspecified: Secondary | ICD-10-CM | POA: Diagnosis not present

## 2016-06-15 DIAGNOSIS — Z7982 Long term (current) use of aspirin: Secondary | ICD-10-CM | POA: Insufficient documentation

## 2016-06-15 DIAGNOSIS — Z87891 Personal history of nicotine dependence: Secondary | ICD-10-CM | POA: Insufficient documentation

## 2016-06-15 DIAGNOSIS — R197 Diarrhea, unspecified: Secondary | ICD-10-CM | POA: Insufficient documentation

## 2016-06-15 DIAGNOSIS — I130 Hypertensive heart and chronic kidney disease with heart failure and stage 1 through stage 4 chronic kidney disease, or unspecified chronic kidney disease: Secondary | ICD-10-CM | POA: Diagnosis not present

## 2016-06-15 DIAGNOSIS — N183 Chronic kidney disease, stage 3 (moderate): Secondary | ICD-10-CM | POA: Diagnosis not present

## 2016-06-15 DIAGNOSIS — Z79899 Other long term (current) drug therapy: Secondary | ICD-10-CM | POA: Insufficient documentation

## 2016-06-15 LAB — URINALYSIS, COMPLETE (UACMP) WITH MICROSCOPIC
BACTERIA UA: NONE SEEN
Bilirubin Urine: NEGATIVE
GLUCOSE, UA: NEGATIVE mg/dL
Ketones, ur: 5 mg/dL — AB
LEUKOCYTES UA: NEGATIVE
NITRITE: NEGATIVE
PH: 5 (ref 5.0–8.0)
Protein, ur: 100 mg/dL — AB
SPECIFIC GRAVITY, URINE: 1.03 (ref 1.005–1.030)

## 2016-06-15 LAB — COMPREHENSIVE METABOLIC PANEL
ALT: 17 U/L (ref 17–63)
AST: 24 U/L (ref 15–41)
Albumin: 4.1 g/dL (ref 3.5–5.0)
Alkaline Phosphatase: 44 U/L (ref 38–126)
Anion gap: 9 (ref 5–15)
BILIRUBIN TOTAL: 1 mg/dL (ref 0.3–1.2)
BUN: 26 mg/dL — ABNORMAL HIGH (ref 6–20)
CALCIUM: 8.9 mg/dL (ref 8.9–10.3)
CO2: 22 mmol/L (ref 22–32)
CREATININE: 1.49 mg/dL — AB (ref 0.61–1.24)
Chloride: 106 mmol/L (ref 101–111)
GFR, EST AFRICAN AMERICAN: 52 mL/min — AB (ref >60)
GFR, EST NON AFRICAN AMERICAN: 45 mL/min — AB (ref >60)
Glucose, Bld: 126 mg/dL — ABNORMAL HIGH (ref 65–99)
Potassium: 3.8 mmol/L (ref 3.5–5.1)
Sodium: 137 mmol/L (ref 135–145)
Total Protein: 7 g/dL (ref 6.5–8.1)

## 2016-06-15 LAB — CBC
HEMATOCRIT: 47.6 % (ref 40.0–52.0)
HEMOGLOBIN: 16.5 g/dL (ref 13.0–18.0)
MCH: 30.1 pg (ref 26.0–34.0)
MCHC: 34.6 g/dL (ref 32.0–36.0)
MCV: 86.9 fL (ref 80.0–100.0)
Platelets: 163 10*3/uL (ref 150–440)
RBC: 5.48 MIL/uL (ref 4.40–5.90)
RDW: 14.1 % (ref 11.5–14.5)
WBC: 10.4 10*3/uL (ref 3.8–10.6)

## 2016-06-15 LAB — LIPASE, BLOOD: LIPASE: 21 U/L (ref 11–51)

## 2016-06-15 MED ORDER — ACETAMINOPHEN 500 MG PO TABS
1000.0000 mg | ORAL_TABLET | Freq: Once | ORAL | Status: AC
  Administered 2016-06-15: 1000 mg via ORAL
  Filled 2016-06-15: qty 2

## 2016-06-15 MED ORDER — SODIUM CHLORIDE 0.9 % IV BOLUS (SEPSIS)
1000.0000 mL | Freq: Once | INTRAVENOUS | Status: AC
  Administered 2016-06-15: 1000 mL via INTRAVENOUS

## 2016-06-15 MED ORDER — SODIUM CHLORIDE 0.9 % IV SOLN
Freq: Once | INTRAVENOUS | Status: AC
  Administered 2016-06-15: 18:00:00 via INTRAVENOUS

## 2016-06-15 NOTE — ED Notes (Signed)
Pt did not have to defecate. Will try again later. C/o HA.

## 2016-06-15 NOTE — ED Notes (Addendum)
Pt wheeled to room 15, states that he is feeling weak and had about 10 episodes of diarrhea starting this AM. Pt states threw up once. Pt ambulatory from wheelchair to bed, denies dizziness. Denies blood in stool and emesis. States was at a family reunion Saturday, ate pork and eggs. Wife having same symptoms. Thinks he might have food poisoning. Also reports HA. Talking in clear and complete sentences. Alert and oriented x 4.

## 2016-06-15 NOTE — ED Provider Notes (Addendum)
[facility] Emergency Department Provider Note   ____________________________________________   First MD Initiated Contact with Patient 06/15/16 1621     (approximate)  I have reviewed the triage vital signs and the nursing notes.   HISTORY  Chief Complaint Diarrhea    HPI [name] is a 76 y.o. male who reports  He was at a  Family reunion on Saturday he had some pork and some eggs. Now he is having diarrhea. He has had 10 diarrheal stools. He vomited once.He is complaining of a headache.he has not had a fever. He does not Has any abdominal pain.  { Past Medical History:  Diagnosis Date  . Anterior urethral stricture   . Aortic ectasia (HCC)   . Atrial fibrillation (HCC)   . BPH (benign prostatic hyperplasia)   . Carotid artery occlusion   . CHF (congestive heart failure) (HCC)   . CKD (chronic kidney disease)   . Coronary artery disease   . Heart murmur   . Hematuria, microscopic   . Hyperlipemia   . Hypertension   . Kidney failure   . Phimosis/adherent prepuce   . Renal artery stenosis (HCC)   . UTI (lower urinary tract infection)     Patient Active Problem List   Diagnosis Date Noted  . Hyperlipidemia 11/07/2015  . BPH (benign prostatic hyperplasia) 11/07/2015  . Benign microscopic hematuria 06/27/2014  . Chronic kidney disease, stage III (moderate) 06/27/2014  . Obesity 06/27/2014  . 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 Coronary atherosclerosis of native coronary artery 06/27/2014  . CAD in native artery 06/27/2014  . Benign essential hematuria 06/27/2014  . Cardiorenal syndrome with renal failure 06/27/2014  . Aftercare following surgery of the circulatory system, NEC 06/01/2013  . Occlusion and stenosis of carotid artery without mention of cerebral infarction 04/27/2012  . Carotid artery obstruction 04/27/2012    Past Surgical History:  Procedure Laterality Date  . CAROTID ENDARTERECTOMY  04/29/2006     right  . CAROTID ENDARTERECTOMY  12/09/2006   left  . CHOLECYSTECTOMY  2012   Gall Bladder  . COLONOSCOPY  05/05/2010  . CORONARY ARTERY BYPASS GRAFT  2008    Prior to Admission medications   Medication Sig Start Date End Date Taking? Authorizing Provider  aspirin 81 MG tablet Take 81 mg by mouth daily.    Historical Provider, MD  carvedilol (COREG) 25 MG tablet Take by mouth. Take 1/2 tablet every morning and 1 tablet every night    Historical Provider, MD  clopidogrel (PLAVIX) 75 MG tablet Take 1 tablet (75 mg total) by mouth daily. 05/13/16   Particia Nearing, PA-C  finasteride (PROSCAR) 5 MG tablet TAKE 1 TABLET DAILY. 06/14/16   Gabriel Cirri, NP  fluticasone (FLONASE) 50 MCG/ACT nasal spray Place 2 sprays into the nose as needed. 02/18/15   Historical Provider, MD  rosuvastatin (CRESTOR) 40 MG tablet Take 1 tablet (40 mg total) by mouth daily. 07/22/15   Gabriel Cirri, NP  sacubitril-valsartan (ENTRESTO) 24-26 MG Take 1 tablet by mouth 2 (two) times daily.    Historical Provider, MD    Allergies Patient has no known allergies.  Family History  Problem Relation Age of Onset  . Heart disease Mother     Before age 74  . Heart attack Mother   . Cancer Mother     Breast  . Hyperlipidemia Mother   . Hypertension Mother   . Diabetes Mother   . Heart disease Father  Heart Disease before age 63  . Heart attack Father   . Hyperlipidemia Father   . Hypertension Father   . Cancer Sister     Breast  . Heart disease Sister     Before age 4  . Hyperlipidemia Sister   . Hypertension Sister   . Heart attack Sister   . Diabetes Sister   . Cancer Daughter     Colon    Social History Social History  Substance Use Topics  . Smoking status: Former Smoker    Types: Cigarettes    Quit date: 02/23/1992  . Smokeless tobacco: Never Used  . Alcohol use No    Review of Systems  Constitutional: No fever/chills Eyes: No visual changes. ENT: No sore  throat. Cardiovascular: Denies chest pain. Respiratory: Denies shortness of breath. Gastrointestinal:see history of illness Genitourinary: Negative for dysuria. Musculoskeletal: Negative for back pain. Skin: Negative for rash. Neurological: Negative for focal weakness or numbness.   ____________________________________________   PHYSICAL EXAM:  VITAL SIGNS: ED Triage Vitals [06/15/16 1542]  Enc Vitals Group     BP (!) 97/58     Pulse Rate 90     Resp 17     Temp 99 F (37.2 C)     Temp Source Oral     SpO2 97 %     Weight 228 lb (103.4 kg)     Height  (1.753 m)     Head Circumference      Peak Flow      Pain Score      Pain Loc      Pain Edu?      Excl. in GC?     Constitutional: Alert and oriented. Well appearing and in no acute distress. Eyes: Conjunctivae are normal. PERRL. EOMI. Head: Atraumatic. Nose: No congestion/rhinnorhea. Mouth/Throat: Mucous membranes are moist.  Oropharynx non-erythematous. Neck: No stridor. Cardiovascular: Normal rate, regular rhythm. Grossly normal heart sounds.  Good peripheral circulation. Respiratory: Normal respiratory effort.  No retractions. Lungs CTAB. Gastrointestinal: Soft and nontender. No distention. No abdominal bruits. No CVA tenderness. Musculoskeletal: No lower extremity tenderness nor edema.  No joint effusions. Neurologic:  Normal speech and language. No gross focal neurologic deficits are appreciated. No gait instability. Skin:  Skin is warm, dry and intact. No rash noted.  ____________________________________________   LABS (all labs ordered are listed, but only abnormal results are displayed)  Labs Reviewed  URINALYSIS, COMPLETE (UACMP) WITH MICROSCOPIC - Abnormal; Notable for the following:       Result Value   Color, Urine AMBER (*)    APPearance CLOUDY (*)    Hgb urine dipstick MODERATE (*)    Ketones, ur 5 (*)    Protein, ur 100 (*)    Squamous Epithelial / LPF 0-5 (*)    All other components  within normal limits  COMPREHENSIVE METABOLIC PANEL - Abnormal; Notable for the following:    Glucose, Bld 126 (*)    BUN 26 (*)    Creatinine, Ser 1.49 (*)    GFR calc non Af Amer 45 (*)    GFR calc Af Amer 52 (*)    All other components within normal limits  C DIFFICILE QUICK SCREEN W PCR REFLEX  GASTROINTESTINAL PANEL BY PCR, STOOL (REPLACES STOOL CULTURE)  CBC  LIPASE, BLOOD   ____________________________________________  EKG   ____________________________________________  RADIOLOGY  ____________________________________________   PROCEDURES  Procedure(s) performed:   Procedures  Critical Care performed:   ____________________________________________   INITIAL IMPRESSION / ASSESSMENT  AND PLAN / ED COURSE  Pertinent labs & imaging results that were available during my care of the patient were reviewed by me and considered in my medical decision making (see chart for details).      Patient with no diarrhea in ER. He feels well with IV fluids   ____________________________________________   FINAL CLINICAL IMPRESSION(S) / ED DIAGNOSES  Final diagnoses:  Diarrhea, unspecified type      NEW MEDICATIONS STARTED DURING THIS VISIT:  New Prescriptions   No medications on file     Note:  This document was prepared using Dragon voice recognition software and may include unintentional dictation errors.    Arnaldo Natal, MD 06/15/16 1934    Arnaldo Natal, MD 06/15/16 [phone]

## 2016-06-15 NOTE — Discharge Instructions (Signed)
Please drink a lot of fluid for the next day or two. You can take peptobismal if you have 1 or 2 more diarrheal stools but if you have more I would recommend returning. Also return for bad abdominal pain or fever.

## 2016-06-15 NOTE — ED Triage Notes (Signed)
Patient presents to ED via POV with c/o diarrhea. Patient states, "I think I have food poisoning. I had stew last night and this morning I couldn't stop having diarrhea". Patient states he has lost 9 pounds since this morning.

## 2016-06-15 NOTE — ED Notes (Signed)
Pt ambulatory to toilet to attempt to collect stool sample.

## 2016-07-14 ENCOUNTER — Other Ambulatory Visit: Payer: Self-pay | Admitting: Unknown Physician Specialty

## 2016-07-14 DIAGNOSIS — N400 Enlarged prostate without lower urinary tract symptoms: Secondary | ICD-10-CM

## 2016-10-22 ENCOUNTER — Ambulatory Visit (INDEPENDENT_AMBULATORY_CARE_PROVIDER_SITE_OTHER): Payer: Medicare Other

## 2016-10-22 VITALS — BP 126/60 | HR 58 | Temp 97.5°F | Resp 17 | Ht 68.0 in | Wt 236.2 lb

## 2016-10-22 DIAGNOSIS — Z0000 Encounter for general adult medical examination without abnormal findings: Secondary | ICD-10-CM

## 2016-10-22 NOTE — Patient Instructions (Addendum)
[name] , Thank you for taking time to come for your Medicare Wellness Visit. I appreciate your ongoing commitment to your health goals. Please review the following plan we discussed and let me know if I can assist you in the future.   Screening recommendations/referrals: Colonoscopy:completed 05/14/2010 Recommended yearly ophthalmology/optometry visit for glaucoma screening and checkup Recommended yearly dental visit for hygiene and checkup  Vaccinations: Influenza vaccine: up to date, due 10/2016 Pneumococcal vaccine: up to date Tdap vaccine: up to date  Shingles vaccine: up to date  Advanced directives: Advance directive discussed with you today. Declined information  today  Conditions/risks identified: none  Next appointment:follow up on  11/08/2016 at 8:00am with Phineas Inchesheryl Wicker,NP. Follow up in one year for your annual wellness exam.   Preventive Care 65 Years and Older, Male Preventive care refers to lifestyle choices and visits with your health care provider that can promote health and wellness. What does preventive care include?  A yearly physical exam. This is also called an annual well check.  Dental exams once or twice a year.  Routine eye exams. Ask your health care provider how often you should have your eyes checked.  Personal lifestyle choices, including:  Daily care of your teeth and gums.  Regular physical activity.  Eating a healthy diet.  Avoiding tobacco and drug use.  Limiting alcohol use.  Practicing safe sex.  Taking low doses of aspirin every day.  Taking vitamin and mineral supplements as recommended by your health care provider. What happens during an annual well check? The services and screenings done by your health care provider during your annual well check will depend on your age, overall health, lifestyle risk factors, and family history of disease. Counseling  Your health care provider may ask you questions about your:  Alcohol  use.  Tobacco use.  Drug use.  Emotional well-being.  Home and relationship well-being.  Sexual activity.  Eating habits.  History of falls.  Memory and ability to understand (cognition).  Work and work Astronomerenvironment. Screening  You may have the following tests or measurements:  Height, weight, and BMI.  Blood pressure.  Lipid and cholesterol levels. These may be checked every 5 years, or more frequently if you are over 74 years old.  Skin check.  Lung cancer screening. You may have this screening every year starting at age 74 if you have a 30-pack-year history of smoking and currently smoke or have quit within the past 15 years.  Fecal occult blood test (FOBT) of the stool. You may have this test every year starting at age 74.  Flexible sigmoidoscopy or colonoscopy. You may have a sigmoidoscopy every 5 years or a colonoscopy every 10 years starting at age 74.  Prostate cancer screening. Recommendations will vary depending on your family history and other risks.  Hepatitis C blood test.  Hepatitis B blood test.  Sexually transmitted disease (STD) testing.  Diabetes screening. This is done by checking your blood sugar (glucose) after you have not eaten for a while (fasting). You may have this done every 1-3 years.  Abdominal aortic aneurysm (AAA) screening. You may need this if you are a current or former smoker.  Osteoporosis. You may be screened starting at age 74 if you are at high risk. Talk with your health care provider about your test results, treatment options, and if necessary, the need for more tests. Vaccines  Your health care provider may recommend certain vaccines, such as:  Influenza vaccine. This is recommended every  year.  Tetanus, diphtheria, and acellular pertussis (Tdap, Td) vaccine. You may need a Td booster every 10 years.  Zoster vaccine. You may need this after age 42.  Pneumococcal 13-valent conjugate (PCV13) vaccine. One dose is  recommended after age 19.  Pneumococcal polysaccharide (PPSV23) vaccine. One dose is recommended after age 72. Talk to your health care provider about which screenings and vaccines you need and how often you need them. This information is not intended to replace advice given to you by your health care provider. Make sure you discuss any questions you have with your health care provider. Document Released: 03/07/2015 Document Revised: 10/29/2015 Document Reviewed: 12/10/2014 Elsevier Interactive Patient Education  2017 [place] Prevention in the Home Falls can cause injuries. They can happen to people of all ages. There are many things you can do to make your home safe and to help prevent falls. What can I do on the outside of my home?  Regularly fix the edges of walkways and driveways and fix any cracks.  Remove anything that might make you trip as you walk through a door, such as a raised step or threshold.  Trim any bushes or trees on the path to your home.  Use bright outdoor lighting.  Clear any walking paths of anything that might make someone trip, such as rocks or tools.  Regularly check to see if handrails are loose or broken. Make sure that both sides of any steps have handrails.  Any raised decks and porches should have guardrails on the edges.  Have any leaves, snow, or ice cleared regularly.  Use sand or salt on walking paths during winter.  Clean up any spills in your garage right away. This includes oil or grease spills. What can I do in the bathroom?  Use night lights.  Install grab bars by the toilet and in the tub and shower. Do not use towel bars as grab bars.  Use non-skid mats or decals in the tub or shower.  If you need to sit down in the shower, use a plastic, non-slip stool.  Keep the floor dry. Clean up any water that spills on the floor as soon as it happens.  Remove soap buildup in the tub or shower regularly.  Attach bath mats  securely with double-sided non-slip rug tape.  Do not have throw rugs and other things on the floor that can make you trip. What can I do in the bedroom?  Use night lights.  Make sure that you have a light by your bed that is easy to reach.  Do not use any sheets or blankets that are too big for your bed. They should not hang down onto the floor.  Have a firm chair that has side arms. You can use this for support while you get dressed.  Do not have throw rugs and other things on the floor that can make you trip. What can I do in the kitchen?  Clean up any spills right away.  Avoid walking on wet floors.  Keep items that you use a lot in easy-to-reach places.  If you need to reach something above you, use a strong step stool that has a grab bar.  Keep electrical cords out of the way.  Do not use floor polish or wax that makes floors slippery. If you must use wax, use non-skid floor wax.  Do not have throw rugs and other things on the floor that can make you trip. What can  I do with my stairs?  Do not leave any items on the stairs.  Make sure that there are handrails on both sides of the stairs and use them. Fix handrails that are broken or loose. Make sure that handrails are as long as the stairways.  Check any carpeting to make sure that it is firmly attached to the stairs. Fix any carpet that is loose or worn.  Avoid having throw rugs at the top or bottom of the stairs. If you do have throw rugs, attach them to the floor with carpet tape.  Make sure that you have a light switch at the top of the stairs and the bottom of the stairs. If you do not have them, ask someone to add them for you. What else can I do to help prevent falls?  Wear shoes that:  Do not have high heels.  Have rubber bottoms.  Are comfortable and fit you well.  Are closed at the toe. Do not wear sandals.  If you use a stepladder:  Make sure that it is fully opened. Do not climb a closed  stepladder.  Make sure that both sides of the stepladder are locked into place.  Ask someone to hold it for you, if possible.  Clearly mark and make sure that you can see:  Any grab bars or handrails.  First and last steps.  Where the edge of each step is.  Use tools that help you move around (mobility aids) if they are needed. These include:  Canes.  Walkers.  Scooters.  Crutches.  Turn on the lights when you go into a dark area. Replace any light bulbs as soon as they burn out.  Set up your furniture so you have a clear path. Avoid moving your furniture around.  If any of your floors are uneven, fix them.  If there are any pets around you, be aware of where they are.  Review your medicines with your doctor. Some medicines can make you feel dizzy. This can increase your chance of falling. Ask your doctor what other things that you can do to help prevent falls. This information is not intended to replace advice given to you by your health care provider. Make sure you discuss any questions you have with your health care provider. Document Released: 12/05/2008 Document Revised: 07/17/2015 Document Reviewed: 03/15/2014 Elsevier Interactive Patient Education  2017 [employer].

## 2016-10-22 NOTE — Progress Notes (Signed)
Subjective:   [name] is a 76 y.o. male who presents for Medicare Annual/Subsequent preventive examination.  Review of Systems:  Cardiac Risk Factors include: male gender;advanced age (>8255men, 49>65 women);obesity (BMI >30kg/m2);hypertension;dyslipidemia     Objective:    Vitals: BP 126/60 (BP Location: Left Arm, Patient Position: Sitting)   Pulse (!) 58   Temp (!) 97.5 F (36.4 C)   Resp 17   Ht 5\' 8"  (1.727 m)   Wt 236 lb 3.2 oz (107.1 kg)   BMI 35.91 kg/m   Body mass index is 35.91 kg/m.  Tobacco History  Smoking Status  . Former Smoker  . Types: Cigarettes  . Quit date: 02/23/1992  Smokeless Tobacco  . Never Used     Counseling given: Not Answered   Past Medical History:  Diagnosis Date  . Anterior urethral stricture   . Aortic ectasia (HCC)   . Atrial fibrillation (HCC)   . BPH (benign prostatic hyperplasia)   . Carotid artery occlusion   . CHF (congestive heart failure) (HCC)   . CKD (chronic kidney disease)   . Coronary artery disease   . Heart murmur   . Hematuria, microscopic   . Hyperlipemia   . Hypertension   . Kidney failure   . Phimosis/adherent prepuce   . Renal artery stenosis (HCC)   . UTI (lower urinary tract infection)    Past Surgical History:  Procedure Laterality Date  . CAROTID ENDARTERECTOMY  04/29/2006   right  . CAROTID ENDARTERECTOMY  12/09/2006   left  . CHOLECYSTECTOMY  2012   Gall Bladder  . COLONOSCOPY  05/05/2010  . CORONARY ARTERY BYPASS GRAFT  2008   Family History  Problem Relation Age of Onset  . Heart disease Mother        Before age 74  . Heart attack Mother   . Cancer Mother        Breast  . Hyperlipidemia Mother   . Hypertension Mother   . Diabetes Mother   . Heart disease Father        Heart Disease before age 74  . Heart attack Father   . Hyperlipidemia Father   . Hypertension Father   . Cancer Sister        Breast  . Heart disease Sister        Before age 74  . Hyperlipidemia Sister     . Hypertension Sister   . Heart attack Sister   . Diabetes Sister   . Cancer Daughter        Colon   History  Sexual Activity  . Sexual activity: No    Outpatient Encounter Prescriptions as of 10/22/2016  Medication Sig  . aspirin 81 MG tablet Take 81 mg by mouth daily.  . carvedilol (COREG) 25 MG tablet Take by mouth. Take 1/2 tablet every morning and 1 tablet every night  . finasteride (PROSCAR) 5 MG tablet TAKE 1 TABLET DAILY.  . rosuvastatin (CRESTOR) 40 MG tablet Take 1 tablet (40 mg total) by mouth daily.  . sacubitril-valsartan (ENTRESTO) 24-26 MG Take 1 tablet by mouth 2 (two) times daily.  . fluticasone (FLONASE) 50 MCG/ACT nasal spray Place 2 sprays into the nose as needed.  . [DISCONTINUED] clopidogrel (PLAVIX) 75 MG tablet Take 1 tablet (75 mg total) by mouth daily. (Patient not taking: Reported on 10/22/2016)   No facility-administered encounter medications on file as of 10/22/2016.     Activities of Daily Living In your present state of health,  do you have any difficulty performing the following activities: 10/22/2016 11/07/2015  Hearing? Malvin Johns  Vision? N N  Difficulty concentrating or making decisions? N N  Walking or climbing stairs? N N  Dressing or bathing? N N  Doing errands, shopping? N N  Preparing Food and eating ? N -  Using the Toilet? N -  In the past six months, have you accidently leaked urine? N -  Do you have problems with loss of bowel control? N -  Managing your Medications? N -  Managing your Finances? N -  Housekeeping or managing your Housekeeping? N -  Some recent data might be hidden    Patient Care Team: Gabriel Cirri, NP as PCP - General (Unknown Physician Specialty) Marcelline Deist, MD as Consulting Physician (Ophthalmology) Vanna Scotland, MD as Consulting Physician (Urology) Laurier Nancy, MD as Consulting Physician (Cardiology)   Assessment:     Exercise Activities and Dietary recommendations Current Exercise Habits:  The patient has a physically strenous job, but has no regular exercise apart from work., Exercise limited by: None identified  Goals    None     Fall Risk Fall Risk  10/22/2016 11/07/2015  Falls in the past year? Yes Yes  Number falls in past yr: 1 2 or more  Comment - Missed a step. And playing golf, stepped in a hole and fell.  Injury with Fall? No No   Depression Screen PHQ 2/9 Scores 10/22/2016 11/07/2015 08/16/2014  PHQ - 2 Score 0 0 0    Cognitive Function     6CIT Screen 10/22/2016  What Year? 0 points  What month? 0 points  What time? 0 points  Count back from 20 0 points  Months in reverse 0 points  Repeat phrase 0 points  Total Score 0    Immunization History  Administered Date(s) Administered  . Influenza, High Dose Seasonal PF 11/07/2015  . Influenza-Unspecified 02/05/2015  . Pneumococcal Conjugate-13 02/13/2014  . Pneumococcal Polysaccharide-23 02/10/2010  . Tdap 07/24/2013   Screening Tests Health Maintenance  Topic Date Due  . INFLUENZA VACCINE  09/22/2016  . COLONOSCOPY  05/13/2020  . TETANUS/TDAP  07/25/2023  . PNA vac Low Risk Adult  Completed      Plan:    I have personally reviewed and addressed the Medicare Annual Wellness questionnaire and have noted the following in the patient's chart:  A. Medical and social history B. Use of alcohol, tobacco or illicit drugs  C. Current medications and supplements D. Functional ability and status E.  Nutritional status F.  Physical activity G. Advance directives H. List of other physicians I.  Hospitalizations, surgeries, and ER visits in previous 12 months J.  Vitals K. Screenings such as hearing and vision if needed, cognitive and depression L. Referrals and appointments  In addition, I have reviewed and discussed with patient certain preventive protocols, quality metrics, and best practice recommendations. A written personalized care plan for preventive services as well as general preventive health  recommendations were provided to patient.   Signed,  Marin Roberts, LPN Nurse Health Advisor   MD Recommendations: none

## 2016-11-08 ENCOUNTER — Encounter: Payer: Self-pay | Admitting: Unknown Physician Specialty

## 2016-11-08 ENCOUNTER — Ambulatory Visit (INDEPENDENT_AMBULATORY_CARE_PROVIDER_SITE_OTHER): Payer: Medicare Other | Admitting: Unknown Physician Specialty

## 2016-11-08 VITALS — BP 140/69 | Temp 97.8°F | Ht 67.75 in | Wt 235.0 lb

## 2016-11-08 DIAGNOSIS — Z7189 Other specified counseling: Secondary | ICD-10-CM | POA: Diagnosis not present

## 2016-11-08 DIAGNOSIS — R7301 Impaired fasting glucose: Secondary | ICD-10-CM

## 2016-11-08 DIAGNOSIS — I2510 Atherosclerotic heart disease of native coronary artery without angina pectoris: Secondary | ICD-10-CM

## 2016-11-08 DIAGNOSIS — B379 Candidiasis, unspecified: Secondary | ICD-10-CM

## 2016-11-08 DIAGNOSIS — I130 Hypertensive heart and chronic kidney disease with heart failure and stage 1 through stage 4 chronic kidney disease, or unspecified chronic kidney disease: Secondary | ICD-10-CM

## 2016-11-08 DIAGNOSIS — Z0000 Encounter for general adult medical examination without abnormal findings: Secondary | ICD-10-CM | POA: Diagnosis not present

## 2016-11-08 MED ORDER — CLOTRIMAZOLE-BETAMETHASONE 1-0.05 % EX CREA: 1.0000 "application " | TOPICAL_CREAM | Freq: Two times a day (BID) | CUTANEOUS | 0 refills | Status: DC

## 2016-11-08 MED ORDER — FLUCONAZOLE 150 MG PO TABS: 150.0000 mg | ORAL_TABLET | Freq: Once | ORAL | 0 refills | Status: AC

## 2016-11-08 NOTE — Assessment & Plan Note (Signed)
At goal.  Continue present medications.   

## 2016-11-08 NOTE — Progress Notes (Signed)
BP 140/69   Temp 97.8 F (36.6 C)   Ht 5' 7.75" (1.721 m)   Wt 235 lb (106.6 kg)   SpO2 97%   BMI 36.00 kg/m    Subjective:    Patient ID: [name], male    DOB: 09/02/1940, 76 y.o.   MRN: [id number]  HPI: [name] is a 76 y.o. male  Chief Complaint  Patient presents with  . Annual Exam  . Rash    x 1 month on his buttocks, itches   Hypertension Using medications without difficulty Average home BPs Good numbers at home  No problems or lightheadedness No chest pain with exertion or shortness of breath No Edema     Hyperlipidemia Using medications without problems: No Muscle aches  Diet compliance: Exercise:  Rash Rash for about one month.  Says it itches.  Tried diaper rash cream and cream from the wound center  BPH Sees urology  Family History  Problem Relation Age of Onset  . Heart disease Mother        Before age 79  . Heart attack Mother   . Cancer Mother        Breast  . Hyperlipidemia Mother   . Hypertension Mother   . Diabetes Mother   . Heart disease Father        Heart Disease before age 58  . Heart attack Father   . Hyperlipidemia Father   . Hypertension Father   . Cancer Sister        Breast  . Heart disease Sister        Before age 82  . Hyperlipidemia Sister   . Hypertension Sister   . Heart attack Sister   . Diabetes Sister   . Cancer Daughter        Colon   Social History   Social History  . Marital status: Married    Spouse name: N/A  . Number of children: N/A  . Years of education: N/A   Occupational History  . Not on file.   Social History Main Topics  . Smoking status: Former Smoker    Types: Cigarettes    Quit date: 02/23/1992  . Smokeless tobacco: Never Used  . Alcohol use No  . Drug use: No  . Sexual activity: No   Other Topics Concern  . Not on file   Social History Narrative  . No narrative on file   Past Medical History:  Diagnosis Date  . Anterior urethral stricture   . Aortic  ectasia (HCC)   . Atrial fibrillation (HCC)   . BPH (benign prostatic hyperplasia)   . Carotid artery occlusion   . CHF (congestive heart failure) (HCC)   . CKD (chronic kidney disease)   . Coronary artery disease   . Heart murmur   . Hematuria, microscopic   . Hyperlipemia   . Hypertension   . Kidney failure   . Phimosis/adherent prepuce   . Renal artery stenosis (HCC)   . UTI (lower urinary tract infection)    Past Surgical History:  Procedure Laterality Date  . CAROTID ENDARTERECTOMY  04/29/2006   right  . CAROTID ENDARTERECTOMY  12/09/2006   left  . CHOLECYSTECTOMY  2012   Gall Bladder  . COLONOSCOPY  05/05/2010  . CORONARY ARTERY BYPASS GRAFT  2008       Relevant past medical, surgical, family and social history reviewed and updated as indicated. Interim medical history since our last visit reviewed. Allergies and  medications reviewed and updated.  Review of Systems  Per HPI unless specifically indicated above     Objective:    BP 140/69   Temp 97.8 F (36.6 C)   Ht 5' 7.75" (1.721 m)   Wt 235 lb (106.6 kg)   SpO2 97%   BMI 36.00 kg/m   Wt Readings from Last 3 Encounters:  11/08/16 235 lb (106.6 kg)  10/22/16 236 lb 3.2 oz (107.1 kg)  06/15/16 228 lb (103.4 kg)    Physical Exam  Constitutional: He is oriented to person, place, and time. He appears well-developed and well-nourished.  HENT:  Head: Normocephalic.  Right Ear: Tympanic membrane, external ear and ear canal normal.  Left Ear: Tympanic membrane, external ear and ear canal normal.  Mouth/Throat: Uvula is midline, oropharynx is clear and moist and mucous membranes are normal.  Eyes: Pupils are equal, round, and reactive to light.  Cardiovascular: Normal rate, regular rhythm and normal heart sounds.  Exam reveals no gallop and no friction rub.   No murmur heard. Pulmonary/Chest: Effort normal and breath sounds normal. No respiratory distress.  Abdominal: Soft. Bowel sounds are normal. He  exhibits no distension. There is no tenderness.  Musculoskeletal: Normal range of motion.  Neurological: He is alert and oriented to person, place, and time. He has normal reflexes.  Skin: Skin is warm and dry.  Psychiatric: He has a normal mood and affect. His behavior is normal. Judgment and thought content normal.    Results for orders placed or performed during the hospital encounter of 06/15/16  CBC  Result Value Ref Range   WBC 10.4 3.8 - 10.6 K/uL   RBC 5.48 4.40 - 5.90 MIL/uL   Hemoglobin 16.5 13.0 - 18.0 g/dL   HCT 40.9 81.1 - 91.4 %   MCV 86.9 80.0 - 100.0 fL   MCH 30.1 26.0 - 34.0 pg   MCHC 34.6 32.0 - 36.0 g/dL   RDW 78.2 95.6 - 21.3 %   Platelets 163 150 - 440 K/uL  Urinalysis, Complete w Microscopic  Result Value Ref Range   Color, Urine AMBER (A) YELLOW   APPearance CLOUDY (A) CLEAR   Specific Gravity, Urine 1.030 1.005 - 1.030   pH 5.0 5.0 - 8.0   Glucose, UA NEGATIVE NEGATIVE mg/dL   Hgb urine dipstick MODERATE (A) NEGATIVE   Bilirubin Urine NEGATIVE NEGATIVE   Ketones, ur 5 (A) NEGATIVE mg/dL   Protein, ur 086 (A) NEGATIVE mg/dL   Nitrite NEGATIVE NEGATIVE   Leukocytes, UA NEGATIVE NEGATIVE   RBC / HPF 6-30 0 - 5 RBC/hpf   WBC, UA 0-5 0 - 5 WBC/hpf   Bacteria, UA NONE SEEN NONE SEEN   Squamous Epithelial / LPF 0-5 (A) NONE SEEN   Mucus PRESENT   Comprehensive metabolic panel  Result Value Ref Range   Sodium 137 135 - 145 mmol/L   Potassium 3.8 3.5 - 5.1 mmol/L   Chloride 106 101 - 111 mmol/L   CO2 22 22 - 32 mmol/L   Glucose, Bld 126 (H) 65 - 99 mg/dL   BUN 26 (H) 6 - 20 mg/dL   Creatinine, Ser 5.78 (H) 0.61 - 1.24 mg/dL   Calcium 8.9 8.9 - 46.9 mg/dL   Total Protein 7.0 6.5 - 8.1 g/dL   Albumin 4.1 3.5 - 5.0 g/dL   AST 24 15 - 41 U/L   ALT 17 17 - 63 U/L   Alkaline Phosphatase 44 38 - 126 U/L   Total Bilirubin 1.0  0.3 - 1.2 mg/dL   GFR calc non Af Amer 45 (L) >60 mL/min   GFR calc Af Amer 52 (L) >60 mL/min   Anion gap 9 5 - 15  Lipase, blood    Result Value Ref Range   Lipase 21 11 - 51 U/L      Assessment & Plan:   Problem List Items Addressed This Visit      Unprioritized   Advanced care planning/counseling discussion    A voluntary discussion about advance care planning including the explanation and discussion of advance directives was extensively discussed  with the patient.  Explanation about the health care proxy and Living will was reviewed and packet with forms with explanation of how to fill them out was given.  During this discussion, the patient was able to identify a health care proxy as his daughter and plans to fill out the paperwork required.  Patient was offered a separate Advance Care Planning visit for further assistance with forms.         CAD in native artery    Sees Dr Welton Flakes with recent echo      Relevant Orders   Lipid Panel w/o Chol/HDL Ratio   Hypertensive heart and renal disease    At goal.  Continue present medications      Relevant Orders   CBC with Differential/Platelet   Comprehensive metabolic panel   Lipid Panel w/o Chol/HDL Ratio    Other Visit Diagnoses    Routine general medical examination at a health care facility    -  Primary   Relevant Orders   Comprehensive metabolic panel   IFG (impaired fasting glucose)       Relevant Orders   Hgb A1c w/o eAG   Yeast infection       Relevant Medications   clotrimazole-betamethasone (LOTRISONE) cream   fluconazole (DIFLUCAN) 150 MG tablet       Follow up plan: Return in about 6 months (around 05/08/2017).

## 2016-11-08 NOTE — Assessment & Plan Note (Signed)
A voluntary discussion about advance care planning including the explanation and discussion of advance directives was extensively discussed  with the patient.  Explanation about the health care proxy and Living will was reviewed and packet with forms with explanation of how to fill them out was given.  During this discussion, the patient was able to identify a health care proxy as his daughter and plans to fill out the paperwork required.  Patient was offered a separate Advance Care Planning visit for further assistance with forms.    

## 2016-11-08 NOTE — Assessment & Plan Note (Signed)
Sees Dr Welton Flakes with recent echo

## 2016-11-09 ENCOUNTER — Encounter: Payer: Self-pay | Admitting: Unknown Physician Specialty

## 2016-11-09 LAB — LIPID PANEL W/O CHOL/HDL RATIO
Cholesterol, Total: 103 mg/dL (ref 100–199)
HDL: 34 mg/dL — ABNORMAL LOW (ref >39)
LDL Calculated: 45 mg/dL (ref 0–99)
TRIGLYCERIDES: 118 mg/dL (ref 0–149)
VLDL Cholesterol Cal: 24 mg/dL (ref 5–40)

## 2016-11-09 LAB — COMPREHENSIVE METABOLIC PANEL
A/G RATIO: 2.1 (ref 1.2–2.2)
ALT: 10 IU/L (ref 0–44)
AST: 14 IU/L (ref 0–40)
Albumin: 4.2 g/dL (ref 3.5–4.8)
Alkaline Phosphatase: 51 IU/L (ref 39–117)
BUN/Creatinine Ratio: 10 (ref 10–24)
BUN: 13 mg/dL (ref 8–27)
Bilirubin Total: 0.4 mg/dL (ref 0.0–1.2)
CALCIUM: 9.1 mg/dL (ref 8.6–10.2)
CO2: 20 mmol/L (ref 20–29)
Chloride: 105 mmol/L (ref 96–106)
Creatinine, Ser: 1.3 mg/dL — ABNORMAL HIGH (ref 0.76–1.27)
GFR calc Af Amer: 62 mL/min/{1.73_m2} (ref >59)
GFR, EST NON AFRICAN AMERICAN: 54 mL/min/{1.73_m2} — AB (ref >59)
GLOBULIN, TOTAL: 2 g/dL (ref 1.5–4.5)
Glucose: 129 mg/dL — ABNORMAL HIGH (ref 65–99)
POTASSIUM: 3.9 mmol/L (ref 3.5–5.2)
SODIUM: 138 mmol/L (ref 134–144)
Total Protein: 6.2 g/dL (ref 6.0–8.5)

## 2016-11-09 LAB — CBC WITH DIFFERENTIAL/PLATELET
BASOS: 0 %
Basophils Absolute: 0 10*3/uL (ref 0.0–0.2)
EOS (ABSOLUTE): 0.2 10*3/uL (ref 0.0–0.4)
EOS: 4 %
HEMATOCRIT: 43.9 % (ref 37.5–51.0)
Hemoglobin: 15.4 g/dL (ref 13.0–17.7)
IMMATURE GRANULOCYTES: 0 %
Immature Grans (Abs): 0 10*3/uL (ref 0.0–0.1)
LYMPHS ABS: 0.8 10*3/uL (ref 0.7–3.1)
Lymphs: 14 %
MCH: 29.7 pg (ref 26.6–33.0)
MCHC: 35.1 g/dL (ref 31.5–35.7)
MCV: 85 fL (ref 79–97)
MONOS ABS: 0.5 10*3/uL (ref 0.1–0.9)
Monocytes: 8 %
NEUTROS ABS: 4.2 10*3/uL (ref 1.4–7.0)
Neutrophils: 74 %
Platelets: 124 10*3/uL — ABNORMAL LOW (ref 150–379)
RBC: 5.18 x10E6/uL (ref 4.14–5.80)
RDW: 14.3 % (ref 12.3–15.4)
WBC: 5.6 10*3/uL (ref 3.4–10.8)

## 2016-11-09 LAB — HGB A1C W/O EAG: HEMOGLOBIN A1C: 5.5 % (ref 4.8–5.6)

## 2016-12-04 ENCOUNTER — Other Ambulatory Visit: Payer: Self-pay | Admitting: Unknown Physician Specialty

## 2017-03-04 ENCOUNTER — Other Ambulatory Visit: Payer: Self-pay | Admitting: Unknown Physician Specialty

## 2017-03-15 ENCOUNTER — Other Ambulatory Visit: Payer: Self-pay | Admitting: Family Medicine

## 2017-03-15 DIAGNOSIS — N400 Enlarged prostate without lower urinary tract symptoms: Secondary | ICD-10-CM

## 2017-03-23 ENCOUNTER — Encounter: Payer: Self-pay | Admitting: Family Medicine

## 2017-03-23 ENCOUNTER — Ambulatory Visit: Payer: Medicare Other | Admitting: Family Medicine

## 2017-03-23 VITALS — BP 145/61 | HR 47 | Temp 97.5°F | Wt 243.7 lb

## 2017-03-23 DIAGNOSIS — J309 Allergic rhinitis, unspecified: Secondary | ICD-10-CM | POA: Diagnosis not present

## 2017-03-23 DIAGNOSIS — R001 Bradycardia, unspecified: Secondary | ICD-10-CM | POA: Diagnosis not present

## 2017-03-23 DIAGNOSIS — R42 Dizziness and giddiness: Secondary | ICD-10-CM

## 2017-03-23 MED ORDER — MECLIZINE HCL 25 MG PO TABS: 25.0000 mg | ORAL_TABLET | Freq: Three times a day (TID) | ORAL | 0 refills | Status: DC | PRN

## 2017-03-23 NOTE — Progress Notes (Signed)
BP (!) 145/61 (BP Location: Left Arm, Patient Position: Sitting, Cuff Size: Large)   Pulse (!) 47   Temp (!) 97.5 F (36.4 C) (Oral)   Wt 243 lb 11.2 oz (110.5 kg)   SpO2 98%   BMI 37.33 kg/m    Subjective:    Patient ID: [name], male    DOB: 04-24-1940, 77 y.o.   MRN: [id number]  HPI: [name] is a 77 y.o. male  Chief Complaint  Patient presents with  . Dizziness   Pt with complicated cardiac hx with afib, CHF, CAD, carotid obstruction presents from Cardiologist today with increased room spinning dizziness upon standing and moving head positionally and some sinus pain and pressure, congestion, and ear pressure worsening the past month. Taking claritin and flonase fairly regularly, often forgets his flonase. Denies fevers, chills, cough, SOB, CP. States he feels in his usual state of health other than his new dizziness and worsening facial and ear pressure.   Upon intake, pulse was found to be in the low to mid 30s. Pulse stays in the low 40s per pt, was 42 this morning at Cardiology visit. Goes back to get more testing tomorrow. Pt denies syncopal episodes, diaphoresis, sweating, poor coordination. Feels at baseline currently.   Past Medical History:  Diagnosis Date  . Anterior urethral stricture   . Aortic ectasia (HCC)   . Atrial fibrillation (HCC)   . BPH (benign prostatic hyperplasia)   . Carotid artery occlusion   . CHF (congestive heart failure) (HCC)   . CKD (chronic kidney disease)   . Coronary artery disease   . Heart murmur   . Hematuria, microscopic   . Hyperlipemia   . Hypertension   . Kidney failure   . Phimosis/adherent prepuce   . Renal artery stenosis (HCC)   . UTI (lower urinary tract infection)    Social History   Socioeconomic History  . Marital status: Married    Spouse name: Not on file  . Number of children: Not on file  . Years of education: Not on file  . Highest education level: Not on file  Social Needs  . Financial  resource strain: Not on file  . Food insecurity - worry: Not on file  . Food insecurity - inability: Not on file  . Transportation needs - medical: Not on file  . Transportation needs - non-medical: Not on file  Occupational History  . Not on file  Tobacco Use  . Smoking status: Former Smoker    Types: Cigarettes    Last attempt to quit: 02/23/1992    Years since quitting: 25.0  . Smokeless tobacco: Never Used  Substance and Sexual Activity  . Alcohol use: No  . Drug use: No  . Sexual activity: No  Other Topics Concern  . Not on file  Social History Narrative  . Not on file   Relevant past medical, surgical, family and social history reviewed and updated as indicated. Interim medical history since our last visit reviewed. Allergies and medications reviewed and updated.  Review of Systems  Constitutional: Negative.   HENT: Positive for ear pain (pressure b/l), postnasal drip, rhinorrhea, sinus pressure and sinus pain.   Eyes: Negative.   Respiratory: Negative.   Cardiovascular: Negative.   Gastrointestinal: Negative.   Musculoskeletal: Negative.   Skin: Negative.   Neurological: Positive for dizziness.  Psychiatric/Behavioral: Negative.    Per HPI unless specifically indicated above     Objective:    BP (!) 145/61 (  BP Location: Left Arm, Patient Position: Sitting, Cuff Size: Large)   Pulse (!) 47   Temp (!) 97.5 F (36.4 C) (Oral)   Wt 243 lb 11.2 oz (110.5 kg)   SpO2 98%   BMI 37.33 kg/m   Wt Readings from Last 3 Encounters:  03/23/17 243 lb 11.2 oz (110.5 kg)  11/08/16 235 lb (106.6 kg)  10/22/16 236 lb 3.2 oz (107.1 kg)    Physical Exam  Constitutional: He is oriented to person, place, and time. He appears well-developed and well-nourished.  HENT:  Head: Atraumatic.  B/l middle ear effusion Posterior oropharynx erythematous Nasal mucosa boggy and injected, rhinorrhea present  Eyes: Conjunctivae are normal. Pupils are equal, round, and reactive to light.    Neck: Normal range of motion. Neck supple.  Cardiovascular:  Bradycardic rate, irregular rhythm   Pulmonary/Chest: Effort normal and breath sounds normal. No respiratory distress.  Musculoskeletal: Normal range of motion.  Lymphadenopathy:    He has no cervical adenopathy.  Neurological: He is alert and oriented to person, place, and time.  Skin: Skin is warm and dry.  Psychiatric: He has a normal mood and affect. His behavior is normal.  Nursing note and vitals reviewed.  Results for orders placed or performed in visit on 11/08/16  CBC with Differential/Platelet  Result Value Ref Range   WBC 5.6 3.4 - 10.8 x10E3/uL   RBC 5.18 4.14 - 5.80 x10E6/uL   Hemoglobin 15.4 13.0 - 17.7 g/dL   Hematocrit 16.143.9 09.637.5 - 51.0 %   MCV 85 79 - 97 fL   MCH 29.7 26.6 - 33.0 pg   MCHC 35.1 31.5 - 35.7 g/dL   RDW 04.514.3 40.912.3 - 81.115.4 %   Platelets 124 (L) 150 - 379 x10E3/uL   Neutrophils 74 Not Estab. %   Lymphs 14 Not Estab. %   Monocytes 8 Not Estab. %   Eos 4 Not Estab. %   Basos 0 Not Estab. %   Neutrophils Absolute 4.2 1.4 - 7.0 x10E3/uL   Lymphocytes Absolute 0.8 0.7 - 3.1 x10E3/uL   Monocytes Absolute 0.5 0.1 - 0.9 x10E3/uL   EOS (ABSOLUTE) 0.2 0.0 - 0.4 x10E3/uL   Basophils Absolute 0.0 0.0 - 0.2 x10E3/uL   Immature Granulocytes 0 Not Estab. %   Immature Grans (Abs) 0.0 0.0 - 0.1 x10E3/uL  Comprehensive metabolic panel  Result Value Ref Range   Glucose 129 (H) 65 - 99 mg/dL   BUN 13 8 - 27 mg/dL   Creatinine, Ser 9.141.30 (H) 0.76 - 1.27 mg/dL   GFR calc non Af Amer 54 (L) >59 mL/min/1.73   GFR calc Af Amer 62 >59 mL/min/1.73   BUN/Creatinine Ratio 10 10 - 24   Sodium 138 134 - 144 mmol/L   Potassium 3.9 3.5 - 5.2 mmol/L   Chloride 105 96 - 106 mmol/L   CO2 20 20 - 29 mmol/L   Calcium 9.1 8.6 - 10.2 mg/dL   Total Protein 6.2 6.0 - 8.5 g/dL   Albumin 4.2 3.5 - 4.8 g/dL   Globulin, Total 2.0 1.5 - 4.5 g/dL   Albumin/Globulin Ratio 2.1 1.2 - 2.2   Bilirubin Total 0.4 0.0 - 1.2 mg/dL    Alkaline Phosphatase 51 39 - 117 IU/L   AST 14 0 - 40 IU/L   ALT 10 0 - 44 IU/L  Lipid Panel w/o Chol/HDL Ratio  Result Value Ref Range   Cholesterol, Total 103 100 - 199 mg/dL   Triglycerides 782118 0 - 149 mg/dL  HDL 34 (L) >39 mg/dL   VLDL Cholesterol Cal 24 5 - 40 mg/dL   LDL Calculated 45 0 - 99 mg/dL  Hgb N8G w/o eAG  Result Value Ref Range   Hgb A1c MFr Bld 5.5 4.8 - 5.6 %      Assessment & Plan:   Problem List Items Addressed This Visit      Respiratory   Allergic rhinitis    Increase flonase to BID, switch to xyzal and take BID during this flare and decrease back to QD once feeling better. Can do saline drops, sinus rinses, humidifier, vapor rubs       Other Visit Diagnoses    Bradycardia    -  Primary   Vertigo       Meclizine prn for vertigo sxs. Increase water intake in case any orthostatic component as well. Orthostatics fairly benign today but reports poor hydration    Cardiology was called after multiple pulse readings in low to mid 30s. CMA spoke with the NP at the office as his doctor was off for the day. Advised to have pt go to ER for readings in the 30s. After this, pulse was reading in the 40s with a high of 47. Pt ambulating without difficulty, speaking coherently and in no distress. Discussed at length with pt and his wife, who are not interested in going to the ER at this time as he feels well and this is a fairly normal range for him. Return precautions to ER discussed at length, they both voiced good understanding and state they will not hesitate to call rescue for worsening sxs. F/u with Cardiologist in the morning for further eval.   Follow up plan: Return in about 1 day (around 03/24/2017) for Cardiology.

## 2017-03-23 NOTE — Patient Instructions (Signed)
Follow up with Cardiology as scheduled tomorrow

## 2017-03-23 NOTE — Assessment & Plan Note (Signed)
Increase flonase to BID, switch to xyzal and take BID during this flare and decrease back to QD once feeling better. Can do saline drops, sinus rinses, humidifier, vapor rubs

## 2017-03-23 NOTE — Progress Notes (Signed)
            **  Contacted patients Cardiologist Dr. Welton FlakesKhan with [facility] in regards to his pulse being in the 30's. I spoke with the nurse who explained to me that Dr. Welton FlakesKhan was off today so patient saw another MD this morning. Her last note stated that patient have an Echocardiogram and Doppler ASAP and to follow-up in 1 week. No note about patient's pulse readings, other than it was 42. The nurse spoke with the Nurse Practitioner and she stated because Dr. Welton FlakesKhan was not available to send patient to the ER or let the decision be Rachel's. Fleet ContrasRachel notified of what information was given.   Caryn Bee-K. Bullock, CMA (AAMA)

## 2017-04-14 ENCOUNTER — Other Ambulatory Visit: Payer: Self-pay | Admitting: Family Medicine

## 2017-04-14 DIAGNOSIS — N400 Enlarged prostate without lower urinary tract symptoms: Secondary | ICD-10-CM

## 2017-04-14 NOTE — Telephone Encounter (Signed)
Your patient 

## 2017-04-22 ENCOUNTER — Other Ambulatory Visit: Payer: Self-pay

## 2017-04-22 ENCOUNTER — Encounter
Admission: RE | Admit: 2017-04-22 | Discharge: 2017-04-22 | Disposition: A | Discharge status: Home / Self Care | Payer: Medicare Other | Source: Ambulatory Visit | Attending: Cardiology | Admitting: Cardiology

## 2017-04-22 ENCOUNTER — Ambulatory Visit
Admission: RE | Admit: 2017-04-22 | Discharge: 2017-04-22 | Disposition: A | Discharge status: Home / Self Care | Payer: Medicare Other | Source: Ambulatory Visit | Attending: Cardiology | Admitting: Cardiology

## 2017-04-22 DIAGNOSIS — I498 Other specified cardiac arrhythmias: Secondary | ICD-10-CM | POA: Diagnosis not present

## 2017-04-22 DIAGNOSIS — Z01811 Encounter for preprocedural respiratory examination: Secondary | ICD-10-CM | POA: Diagnosis present

## 2017-04-22 DIAGNOSIS — I4891 Unspecified atrial fibrillation: Secondary | ICD-10-CM | POA: Insufficient documentation

## 2017-04-22 DIAGNOSIS — Z01810 Encounter for preprocedural cardiovascular examination: Secondary | ICD-10-CM | POA: Diagnosis present

## 2017-04-22 HISTORY — DX: Ventricular premature depolarization: I49.3

## 2017-04-22 HISTORY — DX: Sick sinus syndrome: I49.5

## 2017-04-22 HISTORY — DX: Hyperlipidemia, unspecified: E78.5

## 2017-04-22 HISTORY — DX: Sleep apnea, unspecified: G47.30

## 2017-04-22 HISTORY — DX: Ischemic cardiomyopathy: I25.5

## 2017-04-22 LAB — PROTIME-INR
INR: 0.98
Prothrombin Time: 12.9 seconds (ref 11.4–15.2)

## 2017-04-22 LAB — SURGICAL PCR SCREEN
MRSA, PCR: NEGATIVE
STAPHYLOCOCCUS AUREUS: NEGATIVE

## 2017-04-22 LAB — APTT: APTT: 29 s (ref 24–36)

## 2017-04-22 NOTE — Patient Instructions (Signed)
Your procedure is scheduled on: Tuesday 04/26/17 Report to DAY SURGERY DEPARTMENT LOCATED ON 2ND FLOOR MEDICAL MALL ENTRANCE. To find out your arrival time please call [phone] between 1PM - 3PM on Monday 04/25/17.  Remember: Instructions that are not followed completely may result in serious medical risk, up to and including death, or upon the discretion of your surgeon and anesthesiologist your surgery may need to be rescheduled.     _X__ 1. Do not eat food after midnight the night before your procedure.                 No gum chewing or hard candies. You may drink clear liquids up to 2 hours                 before you are scheduled to arrive for your surgery- DO not drink clear                 liquids within 2 hours of the start of your surgery.                 Clear Liquids include:  water, apple juice without pulp, clear carbohydrate                 drink such as Clearfast or Gatorade, Black Coffee or Tea (Do not add                 anything to coffee or tea).  __X__2.  On the morning of surgery brush your teeth with toothpaste and water, you                 may rinse your mouth with mouthwash if you wish.  Do not swallow any              toothpaste of mouthwash.     _X__ 3.  No Alcohol for 24 hours before or after surgery.   _X__ 4.  Do Not Smoke or use e-cigarettes For 24 Hours Prior to Your Surgery.                 Do not use any chewable tobacco products for at least 6 hours prior to                 surgery.  ____  5.  Bring all medications with you on the day of surgery if instructed.   __X__  6.  Notify your doctor if there is any change in your medical condition      (cold, fever, infections).     Do not wear jewelry, make-up, hairpins, clips or nail polish. Do not wear lotions, powders, or perfumes.  Do not shave 48 hours prior to surgery. Men may shave face and neck. Do not bring valuables to the hospital.    [facility] is not responsible for any belongings or  valuables.  Contacts, dentures/partials or body piercings may not be worn into surgery. Bring a case for your contacts, glasses or hearing aids, a denture cup will be supplied. Leave your suitcase in the car. After surgery it may be brought to your room. For patients admitted to the hospital, discharge time is determined by your treatment team.   Patients discharged the day of surgery will not be allowed to drive home.   Please read over the following fact sheets that you were given:   MRSA Information  __X__ Take these medicines the morning of surgery with A SIP OF WATER:  1. NONE  2.   3.   4.  5.  6.  ____ Fleet Enema (as directed)   __X__ Use CHG Soap/SAGE wipes as directed  ____ Use inhalers on the day of surgery  ____ Stop metformin/Janumet/Farxiga 2 days prior to surgery    ____ Take 1/2 of usual insulin dose the night before surgery. No insulin the morning          of surgery.   __X__ Stop Blood Thinners Coumadin/Plavix/Xarelto/Pleta/Pradaxa/Eliquis/Effient/Aspirin  on    Or contact your Surgeon, Cardiologist or Medical Doctor regarding  ability to stop your blood thinners  __X__ Stop Anti-inflammatories 7 days before surgery such as Advil, Ibuprofen, Motrin,  BC or Goodies Powder, Naprosyn, Naproxen, Aleve, Aspirin    __X__ Stopall herbal supplements, fish oil or vitamin E until after surgery.    ____ Bring C-Pap to the hospital.

## 2017-04-22 NOTE — Pre-Procedure Instructions (Signed)
CBC, MET B in Care Everywhere 04/20/17

## 2017-04-25 MED ORDER — CEFAZOLIN SODIUM-DEXTROSE 1-4 GM/50ML-% IV SOLN
1.0000 g | Freq: Once | INTRAVENOUS | Status: AC
  Administered 2017-04-26: 2 g via INTRAVENOUS

## 2017-04-26 ENCOUNTER — Other Ambulatory Visit: Payer: Self-pay

## 2017-04-26 ENCOUNTER — Ambulatory Visit
Admission: RE | Admit: 2017-04-26 | Discharge: 2017-04-27 | Disposition: A | Discharge status: Home / Self Care | Payer: Medicare Other | Source: Ambulatory Visit | Attending: Cardiology | Admitting: Cardiology

## 2017-04-26 ENCOUNTER — Observation Stay: Payer: Medicare Other

## 2017-04-26 ENCOUNTER — Ambulatory Visit: Payer: Medicare Other | Admitting: Certified Registered Nurse Anesthetist

## 2017-04-26 ENCOUNTER — Ambulatory Visit: Payer: Medicare Other

## 2017-04-26 ENCOUNTER — Encounter
Admission: RE | Disposition: A | Discharge status: Home / Self Care | Payer: Self-pay | Source: Ambulatory Visit | Attending: Cardiology

## 2017-04-26 DIAGNOSIS — Z87891 Personal history of nicotine dependence: Secondary | ICD-10-CM | POA: Diagnosis not present

## 2017-04-26 DIAGNOSIS — I6529 Occlusion and stenosis of unspecified carotid artery: Secondary | ICD-10-CM | POA: Diagnosis not present

## 2017-04-26 DIAGNOSIS — Z6835 Body mass index (BMI) 35.0-35.9, adult: Secondary | ICD-10-CM | POA: Diagnosis not present

## 2017-04-26 DIAGNOSIS — I2510 Atherosclerotic heart disease of native coronary artery without angina pectoris: Secondary | ICD-10-CM | POA: Diagnosis not present

## 2017-04-26 DIAGNOSIS — Z7982 Long term (current) use of aspirin: Secondary | ICD-10-CM | POA: Diagnosis not present

## 2017-04-26 DIAGNOSIS — I739 Peripheral vascular disease, unspecified: Secondary | ICD-10-CM | POA: Insufficient documentation

## 2017-04-26 DIAGNOSIS — I340 Nonrheumatic mitral (valve) insufficiency: Secondary | ICD-10-CM | POA: Diagnosis not present

## 2017-04-26 DIAGNOSIS — E785 Hyperlipidemia, unspecified: Secondary | ICD-10-CM | POA: Diagnosis not present

## 2017-04-26 DIAGNOSIS — G4730 Sleep apnea, unspecified: Secondary | ICD-10-CM | POA: Insufficient documentation

## 2017-04-26 DIAGNOSIS — Z950 Presence of cardiac pacemaker: Secondary | ICD-10-CM

## 2017-04-26 DIAGNOSIS — E669 Obesity, unspecified: Secondary | ICD-10-CM | POA: Insufficient documentation

## 2017-04-26 DIAGNOSIS — I495 Sick sinus syndrome: Secondary | ICD-10-CM | POA: Insufficient documentation

## 2017-04-26 DIAGNOSIS — I130 Hypertensive heart and chronic kidney disease with heart failure and stage 1 through stage 4 chronic kidney disease, or unspecified chronic kidney disease: Secondary | ICD-10-CM | POA: Insufficient documentation

## 2017-04-26 DIAGNOSIS — N183 Chronic kidney disease, stage 3 (moderate): Secondary | ICD-10-CM | POA: Diagnosis not present

## 2017-04-26 DIAGNOSIS — I5022 Chronic systolic (congestive) heart failure: Secondary | ICD-10-CM | POA: Insufficient documentation

## 2017-04-26 DIAGNOSIS — I255 Ischemic cardiomyopathy: Secondary | ICD-10-CM | POA: Insufficient documentation

## 2017-04-26 DIAGNOSIS — Z951 Presence of aortocoronary bypass graft: Secondary | ICD-10-CM | POA: Insufficient documentation

## 2017-04-26 HISTORY — PX: PACEMAKER INSERTION: SHX728

## 2017-04-26 SURGERY — INSERTION, CARDIAC PACEMAKER
Anesthesia: General

## 2017-04-26 MED ORDER — FENTANYL CITRATE (PF) 100 MCG/2ML IJ SOLN
INTRAMUSCULAR | Status: AC
  Filled 2017-04-26: qty 2

## 2017-04-26 MED ORDER — LIDOCAINE 1 % OPTIME INJ - NO CHARGE
INTRAMUSCULAR | Status: DC | PRN
  Administered 2017-04-26: 30 mL

## 2017-04-26 MED ORDER — ONDANSETRON HCL 4 MG/2ML IJ SOLN: 4.0000 mg | Freq: Four times a day (QID) | INTRAMUSCULAR | Status: DC | PRN

## 2017-04-26 MED ORDER — CEFAZOLIN SODIUM-DEXTROSE 1-4 GM/50ML-% IV SOLN
1.0000 g | Freq: Four times a day (QID) | INTRAVENOUS | Status: AC
  Administered 2017-04-26 – 2017-04-27 (×3): 1 g via INTRAVENOUS
  Filled 2017-04-26 (×3): qty 50

## 2017-04-26 MED ORDER — ROSUVASTATIN CALCIUM 10 MG PO TABS: 40.0000 mg | ORAL_TABLET | Freq: Every day | ORAL | Status: DC

## 2017-04-26 MED ORDER — EPHEDRINE SULFATE 50 MG/ML IJ SOLN
INTRAMUSCULAR | Status: DC | PRN
  Administered 2017-04-26: 10 mg via INTRAVENOUS
  Administered 2017-04-26: 5 mg via INTRAVENOUS
  Administered 2017-04-26: 10 mg via INTRAVENOUS

## 2017-04-26 MED ORDER — SACUBITRIL-VALSARTAN 24-26 MG PO TABS
1.0000 | ORAL_TABLET | Freq: Two times a day (BID) | ORAL | Status: DC
  Administered 2017-04-26: 1 via ORAL
  Filled 2017-04-26: qty 1

## 2017-04-26 MED ORDER — LACTATED RINGERS IV SOLN
INTRAVENOUS | Status: DC
  Administered 2017-04-26: 10:00:00 via INTRAVENOUS

## 2017-04-26 MED ORDER — FENTANYL CITRATE (PF) 100 MCG/2ML IJ SOLN
INTRAMUSCULAR | Status: DC | PRN
  Administered 2017-04-26 (×2): 50 ug via INTRAVENOUS

## 2017-04-26 MED ORDER — PROPOFOL 500 MG/50ML IV EMUL
INTRAVENOUS | Status: AC
  Filled 2017-04-26: qty 50

## 2017-04-26 MED ORDER — GENTAMICIN SULFATE 40 MG/ML IJ SOLN
INTRAMUSCULAR | Status: AC
  Filled 2017-04-26: qty 2

## 2017-04-26 MED ORDER — PROPOFOL 500 MG/50ML IV EMUL
INTRAVENOUS | Status: DC | PRN
  Administered 2017-04-26: 75 ug/kg/min via INTRAVENOUS

## 2017-04-26 MED ORDER — CEFAZOLIN SODIUM 1 G IJ SOLR
INTRAMUSCULAR | Status: AC
  Filled 2017-04-26: qty 10

## 2017-04-26 MED ORDER — LACTATED RINGERS IV SOLN
INTRAVENOUS | Status: DC
  Administered 2017-04-26: 12:00:00 via INTRAVENOUS

## 2017-04-26 MED ORDER — ACETAMINOPHEN 325 MG PO TABS
325.0000 mg | ORAL_TABLET | ORAL | Status: DC | PRN
  Administered 2017-04-26 – 2017-04-27 (×2): 650 mg via ORAL
  Filled 2017-04-26 (×2): qty 2

## 2017-04-26 MED ORDER — CEFAZOLIN SODIUM-DEXTROSE 2-4 GM/100ML-% IV SOLN
INTRAVENOUS | Status: AC
  Filled 2017-04-26: qty 100

## 2017-04-26 MED ORDER — PHENYLEPHRINE HCL 10 MG/ML IJ SOLN
INTRAMUSCULAR | Status: DC | PRN
  Administered 2017-04-26: 100 ug via INTRAVENOUS
  Administered 2017-04-26: 200 ug via INTRAVENOUS
  Administered 2017-04-26: 50 ug via INTRAVENOUS
  Administered 2017-04-26: 150 ug via INTRAVENOUS
  Administered 2017-04-26 (×2): 100 ug via INTRAVENOUS
  Administered 2017-04-26: 200 ug via INTRAVENOUS
  Administered 2017-04-26 (×2): 100 ug via INTRAVENOUS

## 2017-04-26 MED ORDER — SODIUM CHLORIDE 0.9 % IR SOLN
Status: DC | PRN
  Administered 2017-04-26: 60 mL

## 2017-04-26 MED ORDER — CEFAZOLIN SODIUM-DEXTROSE 1-4 GM/50ML-% IV SOLN
INTRAVENOUS | Status: AC
  Filled 2017-04-26: qty 50

## 2017-04-26 MED ORDER — FENTANYL CITRATE (PF) 100 MCG/2ML IJ SOLN: 25.0000 ug | INTRAMUSCULAR | Status: DC | PRN

## 2017-04-26 MED ORDER — FAMOTIDINE 20 MG PO TABS
20.0000 mg | ORAL_TABLET | Freq: Once | ORAL | Status: AC
  Administered 2017-04-26: 20 mg via ORAL

## 2017-04-26 MED ORDER — HYDROCODONE-HOMATROPINE 5-1.5 MG/5ML PO SYRP: 5.0000 mL | ORAL_SOLUTION | Freq: Four times a day (QID) | ORAL | Status: DC | PRN

## 2017-04-26 MED ORDER — FLUTICASONE PROPIONATE 50 MCG/ACT NA SUSP
2.0000 | Freq: Every day | NASAL | Status: DC
  Administered 2017-04-27: 2 via NASAL
  Filled 2017-04-26: qty 16

## 2017-04-26 MED ORDER — FAMOTIDINE 20 MG PO TABS
ORAL_TABLET | ORAL | Status: AC
  Administered 2017-04-26: 20 mg via ORAL
  Filled 2017-04-26: qty 1

## 2017-04-26 MED ORDER — AMLODIPINE BESYLATE 5 MG PO TABS
5.0000 mg | ORAL_TABLET | Freq: Every day | ORAL | Status: DC
  Administered 2017-04-26 – 2017-04-27 (×2): 5 mg via ORAL
  Filled 2017-04-26 (×2): qty 1

## 2017-04-26 MED ORDER — BENZONATATE 100 MG PO CAPS: 200.0000 mg | ORAL_CAPSULE | Freq: Three times a day (TID) | ORAL | Status: DC | PRN

## 2017-04-26 MED ORDER — ONDANSETRON HCL 4 MG/2ML IJ SOLN: 4.0000 mg | Freq: Once | INTRAMUSCULAR | Status: DC | PRN

## 2017-04-26 MED ORDER — MIDAZOLAM HCL 2 MG/2ML IJ SOLN
INTRAMUSCULAR | Status: AC
  Filled 2017-04-26: qty 2

## 2017-04-26 MED ORDER — MIDAZOLAM HCL 2 MG/2ML IJ SOLN
INTRAMUSCULAR | Status: DC | PRN
  Administered 2017-04-26: 2 mg via INTRAVENOUS

## 2017-04-26 MED ORDER — SODIUM CHLORIDE 0.9 % IR SOLN
Freq: Once | Status: DC
  Filled 2017-04-26: qty 2

## 2017-04-26 SURGICAL SUPPLY — 37 items
BAG DECANTER FOR FLEXI CONT (MISCELLANEOUS) ×3 IMPLANT
BRUSH SCRUB EZ  4% CHG (MISCELLANEOUS) ×2
BRUSH SCRUB EZ 4% CHG (MISCELLANEOUS) ×1 IMPLANT
CABLE SURG 12 DISP A/V CHANNEL (MISCELLANEOUS) ×3 IMPLANT
CANISTER SUCT 1200ML W/VALVE (MISCELLANEOUS) ×3 IMPLANT
CHLORAPREP W/TINT 26ML (MISCELLANEOUS) ×3 IMPLANT
COVER LIGHT HANDLE STERIS (MISCELLANEOUS) ×6 IMPLANT
COVER MAYO STAND STRL (DRAPES) ×3 IMPLANT
DRAPE C-ARM XRAY 36X54 (DRAPES) ×3 IMPLANT
DRSG TEGADERM 4X4.75 (GAUZE/BANDAGES/DRESSINGS) ×3 IMPLANT
DRSG TELFA 4X3 1S NADH ST (GAUZE/BANDAGES/DRESSINGS) ×3 IMPLANT
ELECT REM PT RETURN 9FT ADLT (ELECTROSURGICAL) ×3
ELECTRODE REM PT RTRN 9FT ADLT (ELECTROSURGICAL) ×1 IMPLANT
GLOVE BIO SURGEON STRL SZ7.5 (GLOVE) ×3 IMPLANT
GLOVE BIO SURGEON STRL SZ8 (GLOVE) ×3 IMPLANT
GOWN STRL REUS W/ TWL LRG LVL3 (GOWN DISPOSABLE) ×1 IMPLANT
GOWN STRL REUS W/ TWL XL LVL3 (GOWN DISPOSABLE) ×1 IMPLANT
GOWN STRL REUS W/TWL LRG LVL3 (GOWN DISPOSABLE) ×3
GOWN STRL REUS W/TWL XL LVL3 (GOWN DISPOSABLE) ×3
IMMOBILIZER SHDR MD LX WHT (SOFTGOODS) IMPLANT
IMMOBILIZER SHDR XL LX WHT (SOFTGOODS) ×2 IMPLANT
INTRO PACEMAKR LEAD 9FR 13CM (INTRODUCER) ×3
INTRO PACEMKR SHEATH II 7FR (MISCELLANEOUS) ×3
INTRODUCER PACEMKR LD 9FR 13CM (INTRODUCER) IMPLANT
INTRODUCER PACEMKR SHTH II 7FR (MISCELLANEOUS) ×1 IMPLANT
IPG PACE AZUR XT DR MRI W1DR01 (Pacemaker) IMPLANT
IV NS 500ML (IV SOLUTION) ×3
IV NS 500ML BAXH (IV SOLUTION) ×1 IMPLANT
KIT TURNOVER KIT A (KITS) ×3 IMPLANT
LABEL OR SOLS (LABEL) ×1 IMPLANT
LEAD CAPSURE NOVUS 5076-52CM (Lead) ×2 IMPLANT
LEAD CAPSURE NOVUS 5076-58CM (Lead) ×2 IMPLANT
MARKER SKIN DUAL TIP RULER LAB (MISCELLANEOUS) ×3 IMPLANT
PACE AZURE XT DR MRI W1DR01 (Pacemaker) ×3 IMPLANT
PACK PACE INSERTION (MISCELLANEOUS) ×3 IMPLANT
PAD ONESTEP ZOLL R SERIES ADT (MISCELLANEOUS) ×3 IMPLANT
SUT SILK 0 SH 30 (SUTURE) ×9 IMPLANT

## 2017-04-26 NOTE — Op Note (Signed)
[facility]   04/26/2017                     1:28 PM  PATIENT:  [name]    PRE-OPERATIVE DIAGNOSIS:  Sick Sinus Syndrome  POST-OPERATIVE DIAGNOSIS:  Same  PROCEDURE:  INSERTION PACEMAKER- INITIAL DUAL CHAMBER  SURGEON:  Marcina MillardAlexander Alexsa Flaum, MD    ANESTHESIA:     PREOPERATIVE INDICATIONS:  Cody HarpClaybon Ray Delsignore is a  77 y.o. male with a diagnosis of Sick Sinus Syndrome who failed conservative measures and elected for surgical management.    The risks benefits and alternatives were discussed with the patient preoperatively including but not limited to the risks of infection, bleeding, cardiopulmonary complications, the need for revision surgery, among others, and the patient was willing to proceed.   OPERATIVE PROCEDURE: The patient was brought to the operating room in a fasting state.  The left pectoral region was prepped and draped in usual sterile manner.  Anesthesia was obtained 1% lidocaine locally.  A 6 cm incision was performed on the left pectoral region.  The pacemaker pocket was generated by electrocautery and blunt dissection.  Axis was obtained to the left subclavian vein by fine-needle aspiration.  MRI compatible leads were positioned into the right ventricular apical septum ( Medtronic [id number] ) and right atrial appendage ( Medtronic [id number] ) under fluoroscopic guidance.  After proper thresholds were obtained the leads were sutured in place.  The leads were connected to an MRI compatible dual-chamber rate responsive pacemaker generator ( Medtronic [id number] ).  The pacemaker pocket was irrigated with saline solution.  The pacemaker generator was positioned into the pocket and the pocket was closed with 2-0 and 4-0 Vicryl, respectively.  Steri-Strips and pressure dressing were applied.

## 2017-04-26 NOTE — H&P (Signed)
Jump to Section ? Document InformationEncounter DetailsLab ResultsLast Filed Vital SignsPatient DemographicsPatient InstructionsPlan of TreatmentProgress NotesReason for VisitSocial HistoryVisit Diagnoses Neysa Bonito, generated on Mar. 05, 2019 Printout Information  Document Contents Document Received Date Document Source Organization  Office Visit Mar. 05, 2019 [place]   Patient Demographics - 77 y.o. Male; born 11/21/1940   Patient Address Communication Language Race / Ethnicity  [address] [place], [place] [address] [phone] [facility]) [phone] (Home) English (Preferred) Dema Severin / Not Hispanic or Latino   Reason for Visit   Reason Comments  Follow-up myoview/holter    Encounter Details   Date Type Department Care Team Description  04/20/2017 Office Visit [facility]  [place], [place] [address]  [phone]  Isaias Cowman, [place]any [place]  [facility]  [place], [place] [address]  [phone]  [phone] (Fax)  Atherosclerosis of native coronary artery of native heart without angina pectoris (Primary Dx);  Shortness of breath on exertion;  Sick sinus syndrome (CMS-HCC);  PVC (premature ventricular contraction);  Fatigue, unspecified type;  Dizziness;  Preop testing   Social History - documented as of this encounter  Tobacco Use Types Packs/Day Years Used Date  Former Smoker      Smokeless Tobacco: Never Used      Alcohol Use Drinks/Week oz/Week Comments  No      Sex Assigned at [employer] Date Recorded  Not on file    Job Start Date Occupation Industry  Not on file Not on file Not on file   Travel History Travel Start Travel End  No recent travel history available.     Last Filed Vital Signs - documented in this encounter  Vital Sign Reading Time Taken  Blood Pressure 142/62 04/20/2017 9:07 AM EST  Pulse 56 04/20/2017 9:07 AM  EST  Temperature - -  Respiratory Rate - -  Oxygen Saturation - -  Inhaled Oxygen Concentration - -  Weight 108.9 kg (240 lb) 04/20/2017 9:07 AM EST  Height 175.3 cm (_0 ) 04/20/2017 9:07 AM EST  Body Mass Index 35.44 04/20/2017 9:07 AM EST   Patient Instructions - documented in this encounter  Patient Instructions Isaias Cowman, MD - 04/20/2017 9:00 AM EST   Patient Education    DASH Diet: Care Instructions Your Care Instructions  The DASH diet is an eating plan that can help lower your blood pressure. DASH stands for Dietary Approaches to Stop Hypertension. Hypertension is high blood pressure. The DASH diet focuses on eating foods that are high in calcium, potassium, and magnesium. These nutrients can lower blood pressure. The foods that are highest in these nutrients are fruits, vegetables, low-fat dairy products, nuts, seeds, and legumes. But taking calcium, potassium, and magnesium supplements instead of eating foods that are high in those nutrients does not have the same effect. The DASH diet also includes whole grains, fish, and poultry. The DASH diet is one of several lifestyle changes your doctor may recommend to lower your high blood pressure. Your doctor may also want you to decrease the amount of sodium in your diet. Lowering sodium while following the DASH diet can lower blood pressure even further than just the DASH diet alone. Follow-up care is a key part of your treatment and safety. Be sure to make and go to all appointments, and call your doctor if you are having problems. It's also a good idea to know your test results and keep a list of the medicines you  take. How can you care for yourself at home? Following the DASH diet  Eat 4 to 5 servings of fruit each day. A serving is 1 medium-sized piece of fruit,  cup chopped or canned fruit, 1/4 cup dried fruit, or 4 ounces ( cup) of fruit juice. Choose fruit more often than fruit juice.  Eat 4 to 5 servings of  vegetables each day. A serving is 1 cup of lettuce or raw leafy vegetables,  cup of chopped or cooked vegetables, or 4 ounces ( cup) of vegetable juice. Choose vegetables more often than vegetable juice.  Get 2 to 3 servings of low-fat and fat-free dairy each day. A serving is 8 ounces of milk, 1 cup of yogurt, or 1  ounces of cheese.  Eat 6 to 8 servings of grains each day. A serving is 1 slice of bread, 1 ounce of dry cereal, or  cup of cooked rice, pasta, or cooked cereal. Try to choose whole-grain products as much as possible.  Limit lean meat, poultry, and fish to 2 servings each day. A serving is 3 ounces, about the size of a deck of cards.  Eat 4 to 5 servings of nuts, seeds, and legumes (cooked dried beans, lentils, and split peas) each week. A serving is 1/3 cup of nuts, 2 tablespoons of seeds, or  cup of cooked beans or peas.  Limit fats and oils to 2 to 3 servings each day. A serving is 1 teaspoon of vegetable oil or 2 tablespoons of salad dressing.  Limit sweets and added sugars to 5 servings or less a week. A serving is 1 tablespoon jelly or jam,  cup sorbet, or 1 cup of lemonade.  Eat less than 2,300 milligrams (mg) of sodium a day. If you limit your sodium to 1,500 mg a day, you can lower your blood pressure even more. Tips for success  Start small. Do not try to make dramatic changes to your diet all at once. You might feel that you are missing out on your favorite foods and then be more likely to not follow the plan. Make small changes, and stick with them. Once those changes become habit, add a few more changes.  Try some of the following: ? Make it a goal to eat a fruit or vegetable at every meal and at snacks. This will make it easy to get the recommended amount of fruits and vegetables each day. ? Try yogurt topped with fruit and nuts for a snack or healthy dessert. ? Add lettuce, tomato, cucumber, and onion to sandwiches. ? Combine a ready-made pizza crust with  low-fat mozzarella cheese and lots of vegetable toppings. Try using tomatoes, squash, spinach, broccoli, carrots, cauliflower, and onions. ? Have a variety of cut-up vegetables with a low-fat dip as an appetizer instead of chips and dip. ? Sprinkle sunflower seeds or chopped almonds over salads. Or try adding chopped walnuts or almonds to cooked vegetables. ? Try some vegetarian meals using beans and peas. Add garbanzo or kidney beans to salads. Make burritos and tacos with mashed pinto beans or black beans. Where can you learn more? Log in to your [facility] My[facility] account at [email or web address] and click on top menu option "Health" then select "Search Medical Library". Enter [phone] in the search box and click the magnify glass to learn more about "DASH Diet: Care Instructions." Current as of: September 12, 2016 Content Version: 11.9  2006-2018 [employer]. Care instructions adapted under license by your healthcare professional. If you have  questions about a medical condition or this instruction, always ask your healthcare professional. Harvey any warranty or liability for your use of this information.     Electronically signed by Isaias Cowman, MD at 04/20/2017 9:35 AM EST       Progress Notes - documented in this encounter  Isaias Cowman, MD - 04/20/2017 9:00 AM EST Formatting of this note might be different from the original. Established Patient Visit   Chief Complaint: Chief Complaint  Patient presents with  . Follow-up  myoview/holter  Date of Service: 04/20/2017 Date of Birth: 07-Jun-1940 PCP: Kathrine Haddock, NP  History of Present Illness: Mr. Domeier is a 77 y.o.male patient who returns for  1. Status post CABG x4 [facility] 2008 2. Ischemic cardiomyopathy 3. Chronic systolic congestive heart failure NYHA class II-III ACC/AHA stage C Etiology: CAD HFpEF 40% 4. Essential hypertension 5. Hyperlipidemia 6. Chronic kidney disease  stage III 7. Peripheral vascular disease 8. Status post bilateral carotid endarterectomy 9. Frequent PVCs 10. Sick sinus syndrome  Patient referred for evaluation of bradycardia and possible pacemaker. Patient was seen by Luz Brazen NP-C and noted to be bradycardic. He was seen on 04/07/2017, ECG revealing sinus bradycardia at a rate of 59 bpm. 2D echocardiogram 03/23/2017 revealed LVEF of 44% with mild to moderate mitral regurgitation. 24-hour Holter monitor was performed which revealed predominant sinus rhythm at a rate of 71 bpm, with episodic sinus bradycardia, mostly occurring during the night but also during the day, with frequent premature ventricular contractions. ETT Myoview was performed 04/15/2017, the patient exercised 6 minutes and 15 seconds on a Bruce protocol without chest pain or ECG changes. Gated scintigraphy revealed LV ejection fraction of 34%. SPECT analysis revealed moderate lateral wall scar without significant ischemia. The patient denies chest pain. He has mild chronic exertional dyspnea. He denies palpitations or heart racing. He denies peripheral edema. The patient continues to have intermittent fatigue and episodic dizziness despite not being carvedilol due to low heart rate.  The patient has essential hypertension, systolic blood pressure mildly elevated, on Entresto and amlodipine, which are well tolerated without apparent side effects. The patient follows a low-sodium, no added salt diet.  Patient has hyperlipidemia, on rosuvastatin, which is well tolerated without apparent side effects, followed by his primary care provider. The patient follows a low-cholesterol, low-fat diet.  Past Medical and Surgical History  Past Medical History Past Medical History:  Diagnosis Date  . Atherosclerotic heart disease of native coronary artery without angina pectoris 06/27/2014  . Benign essential microscopic hematuria 06/27/2014  Overview: Work up with urology 2010  . Chronic  kidney disease, stage III (moderate) (CMS-HCC) 06/27/2014  . 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 Obesity, unspecified 06/27/2014  . Occlusion and stenosis of unspecified carotid artery 04/27/2012  Overview: status post right CEA in March 2008, left CEA in October 2008. He has no history of stroke or TIA. Carotid US due 06/2015  . Sleep apnea   Past Surgical History He has a past surgical history that includes Carotid endarterectomy and Coronary artery bypass graft.   Medications and Allergies  Current Medications  Current Outpatient Medications  Medication Sig Dispense Refill  . amLODIPine (NORVASC) 5 MG tablet Take by mouth.  Marland Kitchen aspirin (ADULT LOW DOSE ASPIRIN) 81 MG EC tablet Take by mouth.  . benzonatate (TESSALON) 200 MG capsule Take 1 capsule (200 mg total) by  mouth 3 (three) times daily as needed for Cough. 30 capsule 0  . ENTRESTO 24-26 mg tablet  . finasteride (PROSCAR) 5 mg tablet daily.  . fluticasone (FLONASE) 50 mcg/actuation nasal spray Place 2 sprays into both nostrils once daily. 16 g 1  . hydrocodone-homatropine (HYCODAN) 5-1.5 mg/5 mL syrup Take 5 mLs by mouth every 6 (six) hours as needed for Cough. 120 mL 0  . rosuvastatin (CRESTOR) 40 MG tablet 1 Tablet once each day ORAL   No current facility-administered medications for this visit.   Allergies: Patient has no known allergies.  Social and Family History  Social History reports that he has quit smoking. He has never used smokeless tobacco. He reports that he does not drink alcohol or use drugs.  Family History History reviewed. No pertinent family history.  Review of Systems   Review of Systems: The patient denies chest pain, shortness of breath, orthopnea, paroxysmal nocturnal dyspnea, pedal edema, palpitations, heart racing, presyncope, syncope. Review of 12 Systems is negative  except as described above.  Physical Examination   Vitals:BP 142/62  Pulse 56  Ht 175.3 cm (_0 )  Wt (!) 108.9 kg (240 lb)  BMI 35.44 kg/m  Ht:175.3 cm (_1 ) Wt:(!) 108.9 kg (240 lb) HMC:NOBS surface area is 2.3 meters squared. Body mass index is 35.44 kg/m.  HEENT: Pupils equally reactive to light and accomodation  Neck: Supple without thyromegaly, carotid pulses 2+ Lungs: clear to auscultation bilaterally; no wheezes, rales, rhonchi Heart: Regular rate and rhythm. No gallops, murmurs or rub Abdomen: soft nontender, nondistended, with normal bowel sounds Extremities: no cyanosis, clubbing, or edema Peripheral Pulses: 2+ in all extremities, 2+ femoral pulses bilaterally  Assessment   77 y.o. male with  1. Atherosclerosis of native coronary artery of native heart without angina pectoris  2. Shortness of breath on exertion  3. Sick sinus syndrome (CMS-HCC)  4. PVC (premature ventricular contraction)  5. Fatigue, unspecified type  6. Dizziness  7. Preop testing   77 year old gentleman with known coronary disease, status post CABG, with ischemic cardia myopathy, chronic systolic congestive heart failure, with estimated LV ejection fraction of 40%. Patient referred for possible pacemaker, in the setting of bradycardia, Holter monitor revealing episodic sinus bradycardia, predominately current at night, but also during the day. Patient has associated episodes of fatigue and dizziness consistent with sick sinus syndrome. Patient unable to tolerate beta-blocker due to episodes of sinus bradycardia. ETT Myoview study revealed moderate lateral wall scar without significant risk ischemia. Patient has essential hypertension, systolic blood pressure mildly elevated on current BP medications.  Plan   1. Continue current medications  2. Counseled patient about low-sodium diet 3. DASH diet printed instructions given to the patient 4. Counseled patient about low-cholesterol diet 5.  Continue rosuvastatin for hyperlipidemia management 6. Low-fat and cholesterol diet printed instructions given to the patient 7. Defer cardiac catheterization 8. Proceed with dual-chamber pacemaker implantation. Patient's ejection fraction by echocardiogram was 40% so we will defer pacemaker defibrillator at this time. The risks, benefits and alternatives of permanent pacemaker implantation were explained to the patient and informed consent was obtained.  Orders Placed This Encounter  Procedures  . Complete Blood Count (CBC)  . Basic Metabolic Panel (BMP)   Return in about 1 week (around 04/27/2017), or after pacemaker.  Isaias Cowman, MD PhD [facility]    Electronically signed by Isaias Cowman, MD at 04/20/2017 2:20 PM EST     Plan of Treatment - documented as of this encounter  Not  on file    Lab Results - documented in this encounter  Table of Contents for Lab Results  Basic Metabolic Panel (BMP) (48/18/5631 10:10 AM EST)  Complete Blood Count (CBC) (04/20/2017 10:10 AM EST)     Basic Metabolic Panel (BMP) (49/70/2637 10:10 AM EST) Basic Metabolic Panel (BMP) (85/88/5027 10:10 AM EST)  Component Value Ref Range Performed At Pathologist Signature  Glucose 87 70 - 110 mg/dL [place] - LAB   Sodium 140 136 - 145 mmol/L [facility] - [facility]   Potassium 4.4 3.6 - 5.1 mmol/L [facility] - [facility]   Chloride 105 97 - 109 mmol/L [facility]   Carbon Dioxide (CO2) 31.9 22.0 - 32.0 mmol/L [facility]   Calcium 9.2 8.7 - 10.3 mg/dL [facility]   Urea Nitrogen (BUN) 20 7 - 25 mg/dL [facility]   Creatinine 1.3 0.7 - 1.3 mg/dL [place] - LAB   Glomerular Filtration Rate (eGFR), MDRD Estimate 54 (L) >60 mL/min/1.73sq m [place] - LAB   BUN/Crea Ratio 15.4 6.0 - 20.0 [facility] - [facility]   Anion Gap w/K 7.5 6.0 - 16.0 [place] - LAB     Basic Metabolic Panel (BMP) (74/01/8785 10:10 AM EST)  Specimen  Blood   Basic Metabolic Panel (BMP) (76/72/0947 10:10 AM EST)  Performing Organization Address City/State/Zipcode Phone Number  [facility] - [facility]  [place], [place] [address]    Back to top of Lab Results    Complete Blood Count (CBC) (04/20/2017 10:10 AM EST) Complete Blood Count (CBC) (04/20/2017 10:10 AM EST)  Component Value Ref Range Performed At Pathologist Signature  WBC (White Blood Cell Count) 5.9 4.1 - 10.2 10^3/uL [facility]   RBC (Red Blood Cell Count) 5.64 4.69 - 6.13 10^6/uL [facility]   Hemoglobin 16.9 14.1 - 18.1 gm/dL [facility]   Hematocrit 49.2 40.0 - 52.0 % [facility]   MCV (Mean Corpuscular Volume) 87.2 80.0 - 100.0 fl [facility]   MCH (Mean Corpuscular Hemoglobin) 30.0 27.0 - 31.2 pg [facility]   MCHC (Mean Corpuscular Hemoglobin Concentration) 34.3 32.0 - 36.0 gm/dL [facility]   Platelet Count 144 (L) 150 - 450 10^3/uL [facility]   RDW-CV (Red Cell Distribution Width) 12.9 11.6 - 14.8 % [facility]   MPV (Mean Platelet Volume) 9.2 (L) 9.4 - 12.4 fl [facility]    Complete Blood Count (CBC) (04/20/2017 10:10 AM EST)  Specimen  Blood   Complete Blood Count (CBC) (04/20/2017 10:10 AM EST)  Performing Organization Address City/State/Zipcode Phone Number  [facility] - LAB  [place],  [phone]    Back to top of Lab Results   Visit Diagnoses - documented in this encounter  Diagnosis  Atherosclerosis of native coronary artery of native heart without angina pectoris - Primary   Shortness of breath on exertion  Shortness of breath   Sick sinus syndrome (CMS-HCC)  Sinoatrial node dysfunction   PVC (premature ventricular contraction)  Other  premature beats   Fatigue, unspecified type   Dizziness  Dizziness and giddiness   Preop testing  Unspecified pre-operative examination    Images Document Information  Primary Care Provider Other Service Providers Document Coverage Dates  Kathrine Haddock NP (Dec. 27, 2016 - Present) [phone] (Work) [phone] (Fax) [address], [place] [address]   Feb. 27, 2019   [place] [address] [place], [place] [address]   Encounter Providers Encounter Date  Isaias Cowman MD (Attending) [phone] (Work) [phone] (Fax) [address] [facility] [place], [place] [address]  Feb. 27, 2019    Show All Sections

## 2017-04-26 NOTE — Anesthesia Preprocedure Evaluation (Signed)
Anesthesia Evaluation  Patient identified by MRN, date of birth, ID band Patient awake    Reviewed: Allergy & Precautions, H&P , NPO status , Patient's Chart, lab work & pertinent test results, reviewed documented beta blocker date and time   History of Anesthesia Complications Negative for: history of anesthetic complications  Airway Mallampati: I  TM Distance: >3 FB Neck ROM: full    Dental  (+) Edentulous Upper, Upper Dentures, Poor Dentition, Dental Advidsory Given, Missing   Pulmonary shortness of breath and with exertion, sleep apnea , neg COPD, neg recent URI, former smoker,           Cardiovascular Exercise Tolerance: Good hypertension, (-) angina+ CAD, + CABG, + Peripheral Vascular Disease and +CHF  (-) Past MI and (-) Cardiac Stents + dysrhythmias Atrial Fibrillation + Valvular Problems/Murmurs      Neuro/Psych negative neurological ROS  negative psych ROS   GI/Hepatic negative GI ROS, Neg liver ROS,   Endo/Other  negative endocrine ROS  Renal/GU CRFRenal disease  negative genitourinary   Musculoskeletal   Abdominal   Peds  Hematology negative hematology ROS (+)   Anesthesia Other Findings Past Medical History: No date: Anterior urethral stricture No date: Aortic ectasia (HCC) No date: Atrial fibrillation (HCC) No date: BPH (benign prostatic hyperplasia) No date: Carotid artery occlusion No date: CHF (congestive heart failure) (HCC) No date: CKD (chronic kidney disease)     Comment:  Stage III No date: Coronary artery disease No date: Frequent PVCs No date: Heart murmur No date: Hematuria, microscopic No date: HLD (hyperlipidemia) No date: Hyperlipemia No date: Hypertension No date: Ischemic cardiomyopathy No date: Kidney failure No date: Phimosis/adherent prepuce No date: Renal artery stenosis (HCC) No date: Sleep apnea No date: SSS (sick sinus syndrome) (HCC) No date: UTI (lower urinary  tract infection)   Reproductive/Obstetrics negative OB ROS                             Anesthesia Physical Anesthesia Plan  ASA: III  Anesthesia Plan: General   Post-op Pain Management:    Induction: Intravenous  PONV Risk Score and Plan: 2 and Propofol infusion  Airway Management Planned: Nasal Cannula  Additional Equipment:   Intra-op Plan:   Post-operative Plan:   Informed Consent: I have reviewed the patients History and Physical, chart, labs and discussed the procedure including the risks, benefits and alternatives for the proposed anesthesia with the patient or authorized representative who has indicated his/her understanding and acceptance.   Dental Advisory Given  Plan Discussed with: Anesthesiologist, CRNA and Surgeon  Anesthesia Plan Comments:         Anesthesia Quick Evaluation

## 2017-04-26 NOTE — Interval H&P Note (Signed)
History and Physical Interval Note:  04/26/2017 11:54 AM  [name]  has presented today for surgery, with the diagnosis of SSS  The various methods of treatment have been discussed with the patient and family. After consideration of risks, benefits and other options for treatment, the patient has consented to  Procedure(s): INSERTION PACEMAKER- INITIAL DUAL CHAMBER (N/A) as a surgical intervention .  The patient's history has been reviewed, patient examined, no change in status, stable for surgery.  I have reviewed the patient's chart and labs.  Questions were answered to the patient's satisfaction.     Jalasia Eskridge [employer]

## 2017-04-26 NOTE — Progress Notes (Signed)
Patient alert and oriented, vss, no complaints of pain.  Education given to patient and his family regarding pacemaker after care - concentrating on s/s of infection.  Patient able to repeat back information.  Atrial paced on monitor.  Planning to discharge in the AM.  Patient's son will be transporting him home.

## 2017-04-26 NOTE — Anesthesia Post-op Follow-up Note (Signed)
Anesthesia QCDR form completed.        

## 2017-04-26 NOTE — Care Management (Signed)
Patient had elective outpatient in bed procedure- pacemaker insertion without complications. Anticipate discharge within 24 hours.

## 2017-04-26 NOTE — Anesthesia Procedure Notes (Signed)
Performed by: Jeyda Siebel, CRNA Pre-anesthesia Checklist: Patient identified, Emergency Drugs available, Suction available, Patient being monitored and Timeout performed Patient Re-evaluated:Patient Re-evaluated prior to induction Oxygen Delivery Method: Simple face mask Induction Type: IV induction       

## 2017-04-26 NOTE — Transfer of Care (Signed)
Immediate Anesthesia Transfer of Care Note  Patient: [name]  Procedure(s) Performed: INSERTION PACEMAKER- INITIAL DUAL CHAMBER (N/A )  Patient Location: PACU  Anesthesia Type:General  Level of Consciousness: sedated  Airway & Oxygen Therapy: Patient Spontanous Breathing and Patient connected to face mask oxygen  Post-op Assessment: Report given to RN and Post -op Vital signs reviewed and stable  Post vital signs: Reviewed and stable  Last Vitals:  Vitals:   04/26/17 1006  BP: (!) 157/85  Pulse: (!) 53  Resp: 18  Temp: (!) 36.1 C  SpO2: 99%    Last Pain:  Vitals:   04/26/17 1006  TempSrc: Temporal         Complications: No apparent anesthesia complications

## 2017-04-27 ENCOUNTER — Encounter: Payer: Self-pay | Admitting: Cardiology

## 2017-04-27 DIAGNOSIS — I495 Sick sinus syndrome: Secondary | ICD-10-CM | POA: Diagnosis not present

## 2017-04-27 MED ORDER — CEPHALEXIN 250 MG PO CAPS: 500.0000 mg | ORAL_CAPSULE | Freq: Two times a day (BID) | ORAL | 0 refills | Status: DC

## 2017-04-27 NOTE — Anesthesia Postprocedure Evaluation (Signed)
Anesthesia Post Note  Patient: [name]  Procedure(s) Performed: INSERTION PACEMAKER- INITIAL DUAL CHAMBER (N/A )  Patient location during evaluation: PACU Anesthesia Type: General Level of consciousness: awake and alert Pain management: pain level controlled Vital Signs Assessment: post-procedure vital signs reviewed and stable Respiratory status: spontaneous breathing, nonlabored ventilation, respiratory function stable and patient connected to nasal cannula oxygen Cardiovascular status: blood pressure returned to baseline and stable Postop Assessment: no apparent nausea or vomiting Anesthetic complications: no     Last Vitals:  Vitals:   04/27/17 0339 04/27/17 0903  BP: 121/68 (!) 149/81  Pulse: 69 63  Resp: 18 18  Temp: 36.9 C 36.7 C  SpO2: 97% 96%    Last Pain:  Vitals:   04/27/17 0903  TempSrc: Oral  PainSc: 4                  Lenard SimmerAndrew Conchita Truxillo

## 2017-04-27 NOTE — Discharge Instructions (Signed)
Do not lift left arm above head. Remove outer bandage 04/28/2017, leave steri-strips on. May shower 04/28/2017.

## 2017-04-27 NOTE — Discharge Summary (Signed)
Physician Discharge Summary  Patient ID: [name] MRN: [id number] DOB/AGE: 75/04/1940 [age] y.o.  Admit date: 04/26/2017 Discharge date: 04/27/2017  Primary Discharge Diagnosis sick sinus syndrome Secondary Discharge Diagnosis cardiomyopathy  Significant Diagnostic Studies: yes  Consults: None  Hospital Course: The patient underwent elective dual-chamber pacemaker implantation 04/27/2017 without complication.  Telemetry revealed predominant sinus rhythm with intermittent atrial pacing with ventricular sensing.  On the morning of 04/28/2017 the patient was ambulating without difficulty and was discharged home.  He is scheduled for follow-up in 1 week   Discharge Exam: Blood pressure 121/68, pulse 69, temperature 98.5 F (36.9 C), temperature source Oral, resp. rate 18, height 5\' 9"  (1.753 m), weight 105.7 kg (233 lb), SpO2 97 %.   General appearance: alert Head: Normocephalic, without obvious abnormality, atraumatic Eyes: conjunctivae/corneas clear. PERRL, EOM's intact. Fundi benign. Ears: normal TM's and external ear canals both ears Nose: Nares normal. Septum midline. Mucosa normal. No drainage or sinus tenderness. Throat: lips, mucosa, and tongue normal; teeth and gums normal Neck: no adenopathy, no carotid bruit, no JVD, supple, symmetrical, trachea midline and thyroid not enlarged, symmetric, no tenderness/mass/nodules Back: symmetric, no curvature. ROM normal. No CVA tenderness. Resp: clear to auscultation bilaterally Chest wall: no tenderness Cardio: regular rate and rhythm, S1, S2 normal, no murmur, click, rub or gallop GI: soft, non-tender; bowel sounds normal; no masses,  no organomegaly Extremities: extremities normal, atraumatic, no cyanosis or edema Pulses: 2+ and symmetric Skin: Skin color, texture, turgor normal. No rashes or lesions Lymph nodes: Cervical, supraclavicular, and axillary nodes normal. Labs:   Lab Results  Component Value Date   WBC 5.6 11/08/2016    HGB 15.4 11/08/2016   HCT 43.9 11/08/2016   MCV 85 11/08/2016   PLT 124 (L) 11/08/2016   No results for input(s): NA, K, CL, CO2, BUN, CREATININE, CALCIUM, PROT, BILITOT, ALKPHOS, ALT, AST, GLUCOSE in the last 168 hours.  Invalid input(s): LABALBU    Radiology: No pneumothorax EKG: Atrial paced rhythm  FOLLOW UP PLANS AND APPOINTMENTS  Allergies as of 04/27/2017   No Known Allergies     Medication List    STOP taking these medications   meclizine 25 MG tablet Commonly known as:  ANTIVERT     TAKE these medications   aspirin EC 81 MG tablet Take 81 mg by mouth at bedtime.   cephALEXin 250 MG capsule Commonly known as:  KEFLEX Take 2 capsules (500 mg total) by mouth 2 (two) times daily.   ENTRESTO 24-26 MG Generic drug:  sacubitril-valsartan Take 1 tablet by mouth 2 (two) times daily. MORNING & NIGHT   finasteride 5 MG tablet Commonly known as:  PROSCAR TAKE 1 TABLET DAILY. What changed:    how much to take  how to take this  when to take this   fluticasone 50 MCG/ACT nasal spray Commonly known as:  FLONASE Place 2 sprays into both nostrils daily as needed for allergies or rhinitis.   NORVASC 5 MG tablet Generic drug:  amLODipine Take 5 mg by mouth at bedtime.   rosuvastatin 40 MG tablet Commonly known as:  CRESTOR Take 1 tablet (40 mg total) by mouth daily. What changed:  when to take this      Follow-up Information    Amunique Neyra, MD Follow up in 1 week(s).   Specialty:  Cardiology Contact information: [address] [facility] [place] [address] [phone]           BRING ALL MEDICATIONS WITH  YOU TO FOLLOW UP APPOINTMENTS  Time spent with patient to include physician time: 25 minutes Signed:  Marcina Millard MD, PhD, [facility] 04/27/2017, 7:58 AM

## 2017-05-11 ENCOUNTER — Ambulatory Visit (INDEPENDENT_AMBULATORY_CARE_PROVIDER_SITE_OTHER): Payer: Medicare Other | Admitting: Unknown Physician Specialty

## 2017-05-11 ENCOUNTER — Encounter: Payer: Self-pay | Admitting: Unknown Physician Specialty

## 2017-05-11 VITALS — BP 137/76 | HR 73 | Temp 97.6°F | Ht 67.75 in | Wt 241.4 lb

## 2017-05-11 DIAGNOSIS — E785 Hyperlipidemia, unspecified: Secondary | ICD-10-CM

## 2017-05-11 DIAGNOSIS — I130 Hypertensive heart and chronic kidney disease with heart failure and stage 1 through stage 4 chronic kidney disease, or unspecified chronic kidney disease: Secondary | ICD-10-CM | POA: Diagnosis not present

## 2017-05-11 NOTE — Assessment & Plan Note (Signed)
Stable last.  Recheck today.  Continue present meds

## 2017-05-11 NOTE — Progress Notes (Signed)
   BP 137/76 (BP Location: Left Arm, Cuff Size: Normal)   Pulse 73   Temp 97.6 F (36.4 C) (Oral)   Ht 5' 7.75" (1.721 m)   Wt 241 lb 6.4 oz (109.5 kg)   SpO2 98%   BMI 36.98 kg/m    Subjective:    Patient ID: [name], male    DOB: 04/12/1940, 77 y.o.   MRN: [id number]  HPI: Cody HarpClaybon Ray Reicks is a 77 y.o. male  Chief Complaint  Patient presents with  . Follow-up   Pt is s/p pacemaker implantation earlier this month.  Doing well with pulse in the 70's as opposed to the 30's.    Hypertension Using medications without difficulty Average home BPs   No problems or lightheadedness No chest pain with exertion or shortness of breath No Edema  Hyperlipidemia Using medications without problems: No Muscle aches  Diet compliance:Exercise: Active at work.  Not exercising yet.    Relevant past medical, surgical, family and social history reviewed and updated as indicated. Interim medical history since our last visit reviewed. Allergies and medications reviewed and updated.  Review of Systems  Constitutional:       Feels better since pacemaker placement  Respiratory: Negative.   Cardiovascular: Negative.   Psychiatric/Behavioral: Negative.     Per HPI unless specifically indicated above     Objective:    BP 137/76 (BP Location: Left Arm, Cuff Size: Normal)   Pulse 73   Temp 97.6 F (36.4 C) (Oral)   Ht 5' 7.75" (1.721 m)   Wt 241 lb 6.4 oz (109.5 kg)   SpO2 98%   BMI 36.98 kg/m   Wt Readings from Last 3 Encounters:  05/11/17 241 lb 6.4 oz (109.5 kg)  04/26/17 233 lb (105.7 kg)  04/22/17 233 lb (105.7 kg)    Physical Exam  Constitutional: He is oriented to person, place, and time. He appears well-developed and well-nourished. No distress.  HENT:  Head: Normocephalic and atraumatic.  Eyes: Conjunctivae and lids are normal. Right eye exhibits no discharge. Left eye exhibits no discharge. No scleral icterus.  Neck: Normal range of motion. Neck supple. No  JVD present. Carotid bruit is not present.  Cardiovascular: Normal rate, regular rhythm and normal heart sounds.  Pulmonary/Chest: Effort normal and breath sounds normal. No respiratory distress.  Abdominal: Normal appearance. There is no splenomegaly or hepatomegaly.  Musculoskeletal: Normal range of motion.  Neurological: He is alert and oriented to person, place, and time.  Skin: Skin is warm, dry and intact. No rash noted. No pallor.  Psychiatric: He has a normal mood and affect. His behavior is normal. Judgment and thought content normal.       Assessment & Plan:   Problem List Items Addressed This Visit      Unprioritized   Hyperlipidemia - Primary    Stable last.  Recheck today.  Continue present meds      Relevant Orders   Lipid Panel w/o Chol/HDL Ratio   Hypertensive heart and renal disease    Stable, continue present medications.        Relevant Orders   Comprehensive metabolic panel       Follow up plan: Return in about 6 months (around 11/11/2017) for physical.

## 2017-05-11 NOTE — Assessment & Plan Note (Signed)
Stable, continue present medications.   

## 2017-05-12 ENCOUNTER — Encounter: Payer: Self-pay | Admitting: Unknown Physician Specialty

## 2017-05-12 LAB — COMPREHENSIVE METABOLIC PANEL
ALT: 17 IU/L (ref 0–44)
AST: 17 IU/L (ref 0–40)
Albumin/Globulin Ratio: 1.6 (ref 1.2–2.2)
Albumin: 3.7 g/dL (ref 3.5–4.8)
Alkaline Phosphatase: 53 IU/L (ref 39–117)
BILIRUBIN TOTAL: 0.4 mg/dL (ref 0.0–1.2)
BUN/Creatinine Ratio: 17 (ref 10–24)
BUN: 20 mg/dL (ref 8–27)
CHLORIDE: 102 mmol/L (ref 96–106)
CO2: 23 mmol/L (ref 20–29)
Calcium: 9 mg/dL (ref 8.6–10.2)
Creatinine, Ser: 1.2 mg/dL (ref 0.76–1.27)
GFR calc non Af Amer: 59 mL/min/{1.73_m2} — ABNORMAL LOW (ref >59)
GFR, EST AFRICAN AMERICAN: 68 mL/min/{1.73_m2} (ref >59)
GLOBULIN, TOTAL: 2.3 g/dL (ref 1.5–4.5)
Glucose: 135 mg/dL — ABNORMAL HIGH (ref 65–99)
POTASSIUM: 4.2 mmol/L (ref 3.5–5.2)
SODIUM: 142 mmol/L (ref 134–144)
TOTAL PROTEIN: 6 g/dL (ref 6.0–8.5)

## 2017-05-12 LAB — LIPID PANEL W/O CHOL/HDL RATIO
Cholesterol, Total: 97 mg/dL — ABNORMAL LOW (ref 100–199)
HDL: 32 mg/dL — AB (ref >39)
LDL CALC: 35 mg/dL (ref 0–99)
Triglycerides: 149 mg/dL (ref 0–149)
VLDL CHOLESTEROL CAL: 30 mg/dL (ref 5–40)

## 2017-06-09 ENCOUNTER — Encounter: Payer: Self-pay | Admitting: Family Medicine

## 2017-06-09 ENCOUNTER — Ambulatory Visit: Payer: Medicare Other | Admitting: Family Medicine

## 2017-06-09 VITALS — BP 134/74 | HR 79 | Temp 98.5°F | Wt 240.0 lb

## 2017-06-09 DIAGNOSIS — R05 Cough: Secondary | ICD-10-CM

## 2017-06-09 DIAGNOSIS — J309 Allergic rhinitis, unspecified: Secondary | ICD-10-CM | POA: Diagnosis not present

## 2017-06-09 DIAGNOSIS — R059 Cough, unspecified: Secondary | ICD-10-CM

## 2017-06-09 MED ORDER — FLUTICASONE PROPIONATE 50 MCG/ACT NA SUSP: 2.0000 | Freq: Two times a day (BID) | NASAL | 6 refills | Status: DC

## 2017-06-09 MED ORDER — CETIRIZINE HCL 10 MG PO TABS: 10.0000 mg | ORAL_TABLET | Freq: Every day | ORAL | 11 refills | Status: DC

## 2017-06-09 MED ORDER — CETIRIZINE HCL 10 MG PO TABS: 10.0000 mg | ORAL_TABLET | Freq: Every day | ORAL | 11 refills | Status: AC

## 2017-06-09 MED ORDER — HYDROCOD POLST-CPM POLST ER 10-8 MG/5ML PO SUER: 5.0000 mL | Freq: Two times a day (BID) | ORAL | 0 refills | Status: AC | PRN

## 2017-06-09 MED ORDER — FLUTICASONE PROPIONATE 50 MCG/ACT NA SUSP: 2.0000 | Freq: Two times a day (BID) | NASAL | 6 refills | Status: AC

## 2017-06-09 MED ORDER — BENZONATATE 200 MG PO CAPS: 200.0000 mg | ORAL_CAPSULE | Freq: Three times a day (TID) | ORAL | 0 refills | Status: AC | PRN

## 2017-06-09 NOTE — Progress Notes (Addendum)
BP 134/74   Pulse 79   Temp 98.5 F (36.9 C) (Oral)   Wt 240 lb (108.9 kg)   SpO2 98%   BMI 36.76 kg/m    Subjective:    Patient ID: [name], male    DOB: [birth year], 77 y.o.   MRN: [id number]  HPI: [name] is a 77 y.o. male  Chief Complaint  Patient presents with  . Nasal Congestion    x 2 days. Pt recovering from pacemaker   Pt here today with dry hacking cough x 2 days, along with over a month of rhinorrhea, sneezing, postnasal drainage. Denies wheezing, SOB, fevers, sore throat. Just had pacemaker inserted last month and states he's here because the coughing is significantly painful at surgical site. Has known hx of allergies, not currently taking anything for them. Has tried OTC cold products with no relief. No sick contacts.   Relevant past medical, surgical, family and social history reviewed and updated as indicated. Interim medical history since our last visit reviewed. Allergies and medications reviewed and updated.  Review of Systems  Per HPI unless specifically indicated above     Objective:    BP 134/74   Pulse 79   Temp 98.5 F (36.9 C) (Oral)   Wt 240 lb (108.9 kg)   SpO2 98%   BMI 36.76 kg/m   Wt Readings from Last 3 Encounters:  06/09/17 240 lb (108.9 kg)  05/11/17 241 lb 6.4 oz (109.5 kg)  04/26/17 233 lb (105.7 kg)    Physical Exam  Constitutional: He is oriented to person, place, and time. He appears well-developed and well-nourished. No distress.  HENT:  Head: Atraumatic.  Right Ear: External ear normal.  Left Ear: External ear normal.  Nasal mucosa and oropharynx erythematous with rhinorrhea present  Eyes: Pupils are equal, round, and reactive to light. EOM are normal.  Neck: Normal range of motion. Neck supple.  Cardiovascular: Normal rate.  Pulmonary/Chest: Effort normal and breath sounds normal. No respiratory distress. He has no wheezes. He has no rales.  Musculoskeletal: Normal range of motion.  Neurological:  He is alert and oriented to person, place, and time.  Skin: Skin is warm and dry.  Psychiatric: He has a normal mood and affect. His behavior is normal.  Nursing note and vitals reviewed.  Results for orders placed or performed in visit on 05/11/17  Comprehensive metabolic panel  Result Value Ref Range   Glucose 135 (H) 65 - 99 mg/dL   BUN 20 8 - 27 mg/dL   Creatinine, Ser 7.82 0.76 - 1.27 mg/dL   GFR calc non Af Amer 59 (L) >59 mL/min/1.73   GFR calc Af Amer 68 >59 mL/min/1.73   BUN/Creatinine Ratio 17 10 - 24   Sodium 142 134 - 144 mmol/L   Potassium 4.2 3.5 - 5.2 mmol/L   Chloride 102 96 - 106 mmol/L   CO2 23 20 - 29 mmol/L   Calcium 9.0 8.6 - 10.2 mg/dL   Total Protein 6.0 6.0 - 8.5 g/dL   Albumin 3.7 3.5 - 4.8 g/dL   Globulin, Total 2.3 1.5 - 4.5 g/dL   Albumin/Globulin Ratio 1.6 1.2 - 2.2   Bilirubin Total 0.4 0.0 - 1.2 mg/dL   Alkaline Phosphatase 53 39 - 117 IU/L   AST 17 0 - 40 IU/L   ALT 17 0 - 44 IU/L  Lipid Panel w/o Chol/HDL Ratio  Result Value Ref Range   Cholesterol, Total 97 (L) 100 - 199  mg/dL   Triglycerides 478149 0 - 149 mg/dL   HDL 32 (L) >29>39 mg/dL   VLDL Cholesterol Cal 30 5 - 40 mg/dL   LDL Calculated 35 0 - 99 mg/dL      Assessment & Plan:   Problem List Items Addressed This Visit      Respiratory   Allergic rhinitis - Primary    Will start good allergy regimen with zyrtec and flonase BID. Coricidin prn as well as humidifier, sinus rinses, allergy eye drops       Other Visit Diagnoses    Cough       Lungs CTAB currently, O2 sats WNL, afebrile. Will tx symptomatically with good allergy regimen, tessalon, tussionex. Sedation and return precautions reviewed       Follow up plan: Return for as scheduled.

## 2017-06-12 NOTE — Assessment & Plan Note (Signed)
Will start good allergy regimen with zyrtec and flonase BID. Coricidin prn as well as humidifier, sinus rinses, allergy eye drops

## 2017-06-12 NOTE — Patient Instructions (Signed)
Follow up as scheduled.  

## 2017-06-22 ENCOUNTER — Other Ambulatory Visit: Payer: Self-pay

## 2017-06-22 DIAGNOSIS — N400 Enlarged prostate without lower urinary tract symptoms: Secondary | ICD-10-CM

## 2017-06-22 MED ORDER — FINASTERIDE 5 MG PO TABS: ORAL_TABLET | ORAL | 0 refills | Status: AC

## 2017-06-22 NOTE — Telephone Encounter (Signed)
Patient seen 05/11/17 and has regular f/up 11/11/17.

## 2017-08-12 ENCOUNTER — Emergency Department
Admission: EM | Admit: 2017-08-12 | Discharge: 2017-08-22 | Disposition: E | Payer: Medicare Other | Attending: Emergency Medicine | Admitting: Emergency Medicine

## 2017-08-12 DIAGNOSIS — I2510 Atherosclerotic heart disease of native coronary artery without angina pectoris: Secondary | ICD-10-CM | POA: Insufficient documentation

## 2017-08-12 DIAGNOSIS — I469 Cardiac arrest, cause unspecified: Secondary | ICD-10-CM | POA: Diagnosis present

## 2017-08-12 DIAGNOSIS — N183 Chronic kidney disease, stage 3 (moderate): Secondary | ICD-10-CM | POA: Insufficient documentation

## 2017-08-12 DIAGNOSIS — Z9049 Acquired absence of other specified parts of digestive tract: Secondary | ICD-10-CM | POA: Insufficient documentation

## 2017-08-12 DIAGNOSIS — Z79899 Other long term (current) drug therapy: Secondary | ICD-10-CM | POA: Insufficient documentation

## 2017-08-12 DIAGNOSIS — Z950 Presence of cardiac pacemaker: Secondary | ICD-10-CM | POA: Insufficient documentation

## 2017-08-12 DIAGNOSIS — Z7982 Long term (current) use of aspirin: Secondary | ICD-10-CM | POA: Insufficient documentation

## 2017-08-12 DIAGNOSIS — Z951 Presence of aortocoronary bypass graft: Secondary | ICD-10-CM | POA: Diagnosis not present

## 2017-08-12 DIAGNOSIS — Z87891 Personal history of nicotine dependence: Secondary | ICD-10-CM | POA: Insufficient documentation

## 2017-08-12 MED ORDER — EPINEPHRINE PF 1 MG/ML IJ SOLN
1.0000 mg | Freq: Once | INTRAMUSCULAR | Status: AC
  Administered 2017-08-12: 1 mg via INTRAVENOUS

## 2017-08-12 NOTE — Code Documentation (Signed)
Pt arrived via ems with cpr in progress/king airway in place/ 12 shocks/6 eppi noted via ems/io noted to left leg with 2 bags of ns bolused/ 20 gauge to rioght ac times one attempt via ed staff/ De Pap at bedsdie with ultraosund machine/ monitor shows asytole/ 7th eppi given at 1007/ no cardiac acitivity noted per ed physcian/ coded called at 1012

## 2017-08-12 NOTE — ED Notes (Signed)
Time of death documented below as 1012 and should have been documented as 2212.

## 2017-08-12 NOTE — ED Provider Notes (Signed)
[facility] Emergency Department Provider Note  Time seen: 10:16 PM  I have reviewed the triage vital signs and the nursing notes.   HISTORY  Chief Complaint Cardiac Arrest (cpr in progress)    HPI [name] is a 77 y.o. male with a past medical history of atrial fibrillation, CHF, CKD, hypertension, hyperlipidemia, sick sinus syndrome status post AICD vs pacemaker who presents to the emergency department for cardiac arrest.  According to EMS report family reports they just returned home from dinner with the patient began gurgling and lost consciousness.  EMS states pink frothy sputum upon arrival, appeared to be ventricular fibrillation.  EMS states CPR was started at 9:15 PM, underwent approximately 20 minutes of CPR, use dual sequential defibrillation and they were able to do a pulses return very briefly per paramedic.  They once again began CPR and transported to the hospital for further treatment.  Only known history upon arrival the patient is a cardiac patient who had a bypass in the past.   Past Medical History:  Diagnosis Date  . Anterior urethral stricture   . Aortic ectasia (HCC)   . Atrial fibrillation (HCC)   . BPH (benign prostatic hyperplasia)   . Carotid artery occlusion   . CHF (congestive heart failure) (HCC)   . CKD (chronic kidney disease)    Stage III  . Coronary artery disease   . Frequent PVCs   . Heart murmur   . Hematuria, microscopic   . HLD (hyperlipidemia)   . Hyperlipemia   . Hypertension   . Ischemic cardiomyopathy   . Kidney failure   . Phimosis/adherent prepuce   . Renal artery stenosis (HCC)   . Sleep apnea   . SSS (sick sinus syndrome) (HCC)   . UTI (lower urinary tract infection)     Patient Active Problem List   Diagnosis Date Noted  . Sick sinus syndrome (HCC) 04/26/2017  . Allergic rhinitis 03/23/2017  . Advanced care planning/counseling discussion 11/08/2016  . Hyperlipidemia 11/07/2015  . BPH  (benign prostatic hyperplasia) 11/07/2015  . Benign microscopic hematuria 06/27/2014  . Chronic kidney disease, stage III (moderate) (HCC) 06/27/2014  . Obesity 06/27/2014  . 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 Coronary atherosclerosis of native coronary artery 06/27/2014  . CAD in native artery 06/27/2014  . Benign essential hematuria 06/27/2014  . Cardiorenal syndrome with renal failure 06/27/2014  . Aftercare following surgery of the circulatory system, NEC 06/01/2013  . Occlusion and stenosis of carotid artery without mention of cerebral infarction 04/27/2012  . Carotid artery obstruction 04/27/2012    Past Surgical History:  Procedure Laterality Date  . CAROTID ENDARTERECTOMY  04/29/2006   right  . CAROTID ENDARTERECTOMY  12/09/2006   left  . CHOLECYSTECTOMY  2012   Gall Bladder  . COLONOSCOPY  05/05/2010  . CORONARY ARTERY BYPASS GRAFT  2008  . PACEMAKER INSERTION N/A 04/26/2017   Procedure: INSERTION PACEMAKER- INITIAL DUAL CHAMBER;  Surgeon: Marcina Millard, MD;  Location: [facility] ORS;  Service: Cardiovascular;  Laterality: N/A;    Prior to Admission medications   Medication Sig Start Date End Date Taking? Authorizing Provider  amLODipine (NORVASC) 5 MG tablet Take 5 mg by mouth at bedtime.    [provider]  aspirin EC 81 MG tablet Take 81 mg by mouth at bedtime.    [provider]  benzonatate (TESSALON) 200 MG capsule Take 1 capsule (200 mg total) by mouth 3 (three) times daily as needed. 06/09/17  Particia NearingLane, Rachel Elizabeth, PA-C  carvedilol (COREG) 12.5 MG tablet Take 12.5 mg by mouth 2 (two) times daily with a meal.    [provider]  cetirizine (ZYRTEC) 10 MG tablet Take 1 tablet (10 mg total) by mouth daily. 06/09/17   Particia NearingLane, Rachel Elizabeth, PA-C  chlorpheniramine-HYDROcodone [facility](TUSSIONEX PENNKINETIC ER) 10-8 MG/5ML SUER Take 5 mLs by mouth every 12 (twelve) hours as needed for cough. 06/09/17   Particia NearingLane, Rachel Elizabeth, PA-C   finasteride (PROSCAR) 5 MG tablet TAKE 1 TABLET(5 MG) DAILY IN THE EVENING. 06/22/17   Gabriel CirriWicker, Cheryl, NP  fluticasone (FLONASE) 50 MCG/ACT nasal spray Place 2 sprays into both nostrils 2 (two) times daily. 06/09/17   Particia NearingLane, Rachel Elizabeth, PA-C  rosuvastatin (CRESTOR) 40 MG tablet Take 1 tablet (40 mg total) by mouth daily. Patient taking differently: Take 40 mg by mouth at bedtime.  03/04/17   Gabriel CirriWicker, Cheryl, NP  sacubitril-valsartan (ENTRESTO) 24-26 MG Take 1 tablet by mouth 2 (two) times daily. MORNING & NIGHT    [provider]    No Known Allergies  Family History  Problem Relation Age of Onset  . Heart disease Mother        Before age 75  . Heart attack Mother   . Cancer Mother        Breast  . Hyperlipidemia Mother   . Hypertension Mother   . Diabetes Mother   . Heart disease Father        Heart Disease before age 75  . Heart attack Father   . Hyperlipidemia Father   . Hypertension Father   . Cancer Sister        Breast  . Heart disease Sister        Before age 75  . Hyperlipidemia Sister   . Hypertension Sister   . Heart attack Sister   . Diabetes Sister   . Cancer Daughter        Colon    Social History Social History   Tobacco Use  . Smoking status: Former Smoker    Types: Cigarettes    Last attempt to quit: 02/23/1992    Years since quitting: 25.4  . Smokeless tobacco: Never Used  Substance Use Topics  . Alcohol use: No  . Drug use: No    Review of Systems Unable to obtain review of systems secondary to cardiac arrest ____________________________________________   PHYSICAL EXAM:  Constitutional: Unresponsive, CPR in progress with Samuel BoucheLucas device, King airway in place. Eyes: 3 mm, no reaction to light. ENT   Head: Normocephalic    Mouth/Throat: King airway in place with bloody sputum coming from mouth and airway. Cardiovascular: Samuel BoucheLucas device in place providing CPR.  No heart sounds with Lucas device pause. Respiratory: King airway in  place, equal breath sounds bilaterally with rhonchi bilaterally.  Nothing auscultated over the epigastrium. Gastrointestinal: Obese appearing abdomen but soft, no obvious trauma. Musculoskeletal: IO device in right lower extremity.  Extremities appear atraumatic Neurologic: Unresponsive, no pupillary reaction. Skin:  Skin is warm, dry  Psychiatric: Unresponsive  ____________________________________________   INITIAL IMPRESSION / ASSESSMENT AND PLAN / ED COURSE  Pertinent labs & imaging results that were available during my care of the patient were reviewed by me and considered in my medical decision making (see chart for details).  Patient arrives via EMS emergency traffic for CPR in progress.  According to EMS report they began compressions at 9:15 PM, provided CPR 10 defibrillations, multiple rounds of epinephrine, Narcan, amiodarone.  Briefly had return  of spontaneous circulation approximately 20 to 30 minutes after beginning CPR.  States this is very brief and the patient once again lost pulses.  CPR was continued and transport to the emergency department and additional 30 minutes.  Upon arrival patient has not had a spontaneous pulse for over 30 minutes per paramedics.  CPR was continued via Bolivar device.  King airway in place.  On examination the patient has fixed pupils.  His asystole on cardiac monitoring, no pulse palpated.  IV access was obtained, epinephrine given through the IV, CPR continued.  After the next round of CPR we again did a pulse check rhythm check.  Patient remains in asystole.  Bedside ultrasound performed by myself shows no cardiac activity.  Given cardiac standstill on ultrasound with asystole on the monitor, no pulses and fixed pupils time of death was called at 10:12pm.  Discussed the unfortunate results with family members.  ____________________________________________   FINAL CLINICAL IMPRESSION(S) / ED DIAGNOSES  Cardiac arrest CPR, unsuccessful     Minna Antis, MD 08/15/2017 2223

## 2017-08-12 NOTE — ED Triage Notes (Addendum)
[facility] called spoke to [name]/possible tissue donation will speak to [phone]/[name]

## 2017-08-12 NOTE — Code Documentation (Signed)
Patient time of death occurred at [id number]

## 2017-08-13 MED FILL — Medication: Qty: 1 | Status: AC

## 2017-08-18 ENCOUNTER — Telehealth: Payer: Self-pay | Admitting: Family Medicine

## 2017-08-18 NOTE — Telephone Encounter (Signed)
Copied from CRM [phone]#[id number]. Topic: General - Other >> Aug 18, 2017  2:01 PM Terisa Starraylor, Brittany L wrote: Reason for CRM: Geraldine Contrasee from [employer] called and said she dropped off a death certificate today and that she received a call back saying that Elnita MaxwellCheryl would not sign the death certificate because he passed away at the hospital. [facility] and spoke with the nursing supervisor Dewayne Hatchnn, she told her that as long as the patient had been seen by his pcp in the last 12 months that it was by law that the pcp sign off on the death certificate. Please call her at [phone]  >> Aug 18, 2017  2:09 PM Adela PortsWilliamson, Christan M wrote: Please advise.

## 2017-08-22 NOTE — ED Notes (Signed)
[employer] at bedside to collect patient

## 2017-08-22 NOTE — Progress Notes (Signed)
   17-Jun-2017 2200  Clinical Encounter Type  Visited With Family  Visit Type Initial   Chaplain paged to ED for incoming EMS patient with CPR in progress.  Chaplain checked in with ED staff, went to waiting room to look for family.  Approx. 15 relatives and church members taken to private conference room.  Chaplain stayed with family and their pastor while awaiting the doctor, who gave the family the news that their loved one was deceased.  When room was ready, Chaplain brought family to see the deceased and say their goodbyes, led prayer for all gathered in the room, brought family back to waiting area, and then escorted family (wife of patient was in wheelchair) to car to leave the hospital.

## 2017-08-22 NOTE — ED Notes (Signed)
Charge RN gave patient's wedding band and medical alert bracelet to wife.

## 2017-08-22 NOTE — ED Notes (Signed)
Patient's family has left bedside.

## 2017-08-22 DEATH — deceased

## 2017-11-11 ENCOUNTER — Encounter: Payer: Medicare Other | Admitting: Unknown Physician Specialty
